# Patient Record
Sex: Female | Born: 1970 | Race: White | Hispanic: No | Smoking: Never smoker
Health system: Southern US, Community
[De-identification: ages and names within clinical notes are randomized; demographics above are authoritative.]

## PROBLEM LIST (undated history)

## (undated) DIAGNOSIS — Z9289 Personal history of other medical treatment: Secondary | ICD-10-CM

## (undated) DIAGNOSIS — Z803 Family history of malignant neoplasm of breast: Secondary | ICD-10-CM

## (undated) DIAGNOSIS — G43839 Menstrual migraine, intractable, without status migrainosus: Secondary | ICD-10-CM

## (undated) DIAGNOSIS — Z1379 Encounter for other screening for genetic and chromosomal anomalies: Secondary | ICD-10-CM

## (undated) DIAGNOSIS — F32A Depression, unspecified: Secondary | ICD-10-CM

## (undated) HISTORY — DX: Menstrual migraine, intractable, without status migrainosus: G43.839

## (undated) HISTORY — DX: Personal history of other medical treatment: Z92.89

## (undated) HISTORY — PX: AUGMENTATION MAMMAPLASTY: SUR837

## (undated) HISTORY — DX: Encounter for other screening for genetic and chromosomal anomalies: Z13.79

## (undated) HISTORY — DX: Family history of malignant neoplasm of breast: Z80.3

## (undated) HISTORY — DX: Depression, unspecified: F32.A

---
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
## 2009-10-16 ENCOUNTER — Ambulatory Visit: Payer: Self-pay

## 2012-01-23 ENCOUNTER — Emergency Department: Payer: Self-pay | Admitting: Unknown Physician Specialty

## 2012-01-23 LAB — BASIC METABOLIC PANEL
Anion Gap: 10 (ref 7–16)
Chloride: 103 mmol/L (ref 98–107)
Co2: 24 mmol/L (ref 21–32)
Creatinine: 1.19 mg/dL (ref 0.60–1.30)
EGFR (Non-African Amer.): 57 — ABNORMAL LOW
Potassium: 3.9 mmol/L (ref 3.5–5.1)
Sodium: 137 mmol/L (ref 136–145)

## 2012-01-23 LAB — CBC
HCT: 39.5 % (ref 35.0–47.0)
HGB: 13.7 g/dL (ref 12.0–16.0)
MCHC: 34.7 g/dL (ref 32.0–36.0)
MCV: 88 fL (ref 80–100)
Platelet: 310 10*3/uL (ref 150–440)
RBC: 4.51 10*6/uL (ref 3.80–5.20)

## 2012-01-23 LAB — URINALYSIS, COMPLETE
Glucose,UR: NEGATIVE mg/dL (ref 0–75)
Nitrite: NEGATIVE
Ph: 5 (ref 4.5–8.0)
WBC UR: 6 /HPF (ref 0–5)

## 2013-01-01 DIAGNOSIS — Z803 Family history of malignant neoplasm of breast: Secondary | ICD-10-CM

## 2013-01-01 HISTORY — DX: Family history of malignant neoplasm of breast: Z80.3

## 2014-01-28 DIAGNOSIS — Z9289 Personal history of other medical treatment: Secondary | ICD-10-CM

## 2014-01-28 DIAGNOSIS — G43839 Menstrual migraine, intractable, without status migrainosus: Secondary | ICD-10-CM

## 2014-01-28 HISTORY — DX: Personal history of other medical treatment: Z92.89

## 2014-01-28 HISTORY — DX: Menstrual migraine, intractable, without status migrainosus: G43.839

## 2014-01-28 LAB — HM PAP SMEAR

## 2015-05-12 LAB — HM MAMMOGRAPHY

## 2015-05-29 DIAGNOSIS — G43829 Menstrual migraine, not intractable, without status migrainosus: Secondary | ICD-10-CM | POA: Insufficient documentation

## 2016-05-21 ENCOUNTER — Other Ambulatory Visit: Payer: Self-pay | Admitting: Obstetrics and Gynecology

## 2016-05-28 ENCOUNTER — Telehealth: Payer: Self-pay

## 2016-05-28 NOTE — Telephone Encounter (Signed)
Was to be short term option. Have pt go off and see if BTB sx are resolved. If recurs, will try progesterone OCPs again.

## 2016-05-28 NOTE — Telephone Encounter (Signed)
Pt calling to let ABC know the progesterone has mad a world of difference.  Would like to have a rx sent in to [facility] [address].  C# [phone].

## 2016-06-01 NOTE — Telephone Encounter (Signed)
Pt aware.  States she will run out in a few days so we'll find out.

## 2016-12-21 ENCOUNTER — Other Ambulatory Visit: Payer: Self-pay | Admitting: Obstetrics and Gynecology

## 2016-12-21 MED ORDER — NORETHINDRONE 0.35 MG PO TABS: 1.0000 | ORAL_TABLET | Freq: Every day | ORAL | 1 refills | Status: DC

## 2016-12-21 NOTE — Progress Notes (Signed)
Rx camila to help stop bleeding with IUD. F/u prn.

## 2016-12-21 NOTE — Telephone Encounter (Signed)
Pt aware and very appreciative. 

## 2016-12-21 NOTE — Telephone Encounter (Signed)
Patient's call was routed to my voicemail. She requests that Helmut Muster call in the same medication she was prescribed 6 or 8 mos ago that cleared up the spotting. She has now started spotting again, and for the last 3 weeks has had light spotting every day. She would like another prescription, or if Helmut Muster would like to see her she would like to be seen soon. [phone]

## 2016-12-21 NOTE — Telephone Encounter (Signed)
Please advise. Thank you

## 2016-12-21 NOTE — Telephone Encounter (Signed)
Rx camila eRxd. RN to notify pt. F/u prn.

## 2017-02-20 ENCOUNTER — Other Ambulatory Visit: Payer: Self-pay | Admitting: Obstetrics and Gynecology

## 2017-04-15 ENCOUNTER — Ambulatory Visit (INDEPENDENT_AMBULATORY_CARE_PROVIDER_SITE_OTHER): Payer: BLUE CROSS/BLUE SHIELD | Admitting: Obstetrics and Gynecology

## 2017-04-15 ENCOUNTER — Ambulatory Visit (INDEPENDENT_AMBULATORY_CARE_PROVIDER_SITE_OTHER): Payer: BLUE CROSS/BLUE SHIELD

## 2017-04-15 ENCOUNTER — Encounter: Payer: Self-pay | Admitting: Obstetrics and Gynecology

## 2017-04-15 VITALS — BP 110/74 | HR 72 | Ht 64.0 in | Wt 164.0 lb

## 2017-04-15 DIAGNOSIS — Z124 Encounter for screening for malignant neoplasm of cervix: Secondary | ICD-10-CM | POA: Diagnosis not present

## 2017-04-15 DIAGNOSIS — Z975 Presence of (intrauterine) contraceptive device: Secondary | ICD-10-CM

## 2017-04-15 DIAGNOSIS — Z30431 Encounter for routine checking of intrauterine contraceptive device: Secondary | ICD-10-CM

## 2017-04-15 DIAGNOSIS — Z01419 Encounter for gynecological examination (general) (routine) without abnormal findings: Secondary | ICD-10-CM

## 2017-04-15 DIAGNOSIS — Z1151 Encounter for screening for human papillomavirus (HPV): Secondary | ICD-10-CM | POA: Diagnosis not present

## 2017-04-15 DIAGNOSIS — Z1239 Encounter for other screening for malignant neoplasm of breast: Secondary | ICD-10-CM

## 2017-04-15 DIAGNOSIS — T8332XA Displacement of intrauterine contraceptive device, initial encounter: Secondary | ICD-10-CM

## 2017-04-15 DIAGNOSIS — N921 Excessive and frequent menstruation with irregular cycle: Secondary | ICD-10-CM | POA: Diagnosis not present

## 2017-04-15 DIAGNOSIS — Z1231 Encounter for screening mammogram for malignant neoplasm of breast: Secondary | ICD-10-CM | POA: Diagnosis not present

## 2017-04-15 NOTE — Progress Notes (Signed)
PCP:  Patient, No Pcp Per   Chief Complaint  Patient presents with  . Gynecologic Exam    CK IUD/BLEEDING     HPI:      Ms. Karla Banks is a 47 y.o. G2P1011 who LMP was Patient's last menstrual period was 04/10/2017., presents today for her annual examination.  Her menses are regular every 28-30 days, lasting 7 days, light spotting. She also then has 2-3 days light spotting mid cycle with IUD. She has tried POPs with IUD several times with sx relief. Dysmenorrhea none. Migraines are less with IUD and notriptyline daily.   Sex activity: single partner, contraception - IUD. Mirena placed 07/01/15 Last Pap: January 28, 2014  Results were: no abnormalities /neg HPV DNA  Hx of STDs: none  Last mammogram: May 12, 2015  Results were: normal--routine follow-up in 12 months There is a FH of breast cancer MGM, MGGM, and 3 mat grt aunts. Pt is MyRisk neg 10/14, IBIS=19%. There is no FH of ovarian cancer. The patient does do self-breast exams.  Tobacco use: The patient denies current or previous tobacco use. Alcohol use: social drinker No drug use.  Exercise: moderately active  She does get adequate calcium but not Vitamin D in her diet.   Past Medical History:  Diagnosis Date  . Family history of breast cancer 01/01/2013   pt is My Risk neg; IBIS = 19.1%  . Genetic testing of female    negative  . History of mammogram 02/25/14; 05/12/15   NEG; NEG  . History of Papanicolaou smear of cervix 01/28/2014   -/-  . Intractable menstrual migraine without status migrainosus 01/28/2014   ON [place] WK OF OCPS. TRY CONT DOSING    Past Surgical History:  Procedure Laterality Date  . CESAREAN SECTION    . CYSTOSCOPY  1981   BLADDER STRETCHED AS A CHILD    Family History  Problem Relation Age of Onset  . Heart disease Father   . Hyperlipidemia Father   . Cancer Maternal Grandmother 40       BREAST; RARE AORTIC CA - 70S  . Cancer Other        BREAST  . Cancer Other    BREAST    Social History   Socioeconomic History  . Marital status: Married    Spouse name: Not on file  . Number of children: 1  . Years of education: 24  . Highest education level: Not on file  Social Needs  . Financial resource strain: Not on file  . Food insecurity - worry: Not on file  . Food insecurity - inability: Not on file  . Transportation needs - medical: Not on file  . Transportation needs - non-medical: Not on file  Occupational History  . Not on file  Tobacco Use  . Smoking status: Never Smoker  . Smokeless tobacco: Never Used  Substance and Sexual Activity  . Alcohol use: Yes  . Drug use: No  . Sexual activity: Yes    Birth control/protection: IUD  Other Topics Concern  . Not on file  Social History Narrative  . Not on file    Current Meds  Medication Sig  . levonorgestrel (MIRENA, 52 MG,) 20 MCG/24HR IUD 1 Device by Intrauterine route continuous.  . norethindrone (MICRONOR,CAMILA,ERRIN) 0.35 MG tablet Take 1 tablet (0.35 mg total) by mouth daily.  . nortriptyline (PAMELOR) 10 MG capsule Take 1 capsule by mouth daily as needed.  . [DISCONTINUED] norethindrone (MICRONOR,CAMILA,ERRIN) 0.35 MG tablet  Take 1 tablet (0.35 mg total) by mouth daily.     ROS:  Review of Systems  Constitutional: Negative for fatigue, fever and unexpected weight change.  Respiratory: Negative for cough, shortness of breath and wheezing.   Cardiovascular: Negative for chest pain, palpitations and leg swelling.  Gastrointestinal: Negative for blood in stool, constipation, diarrhea, nausea and vomiting.  Endocrine: Negative for cold intolerance, heat intolerance and polyuria.  Genitourinary: Positive for vaginal bleeding. Negative for dyspareunia, dysuria, flank pain, frequency, genital sores, hematuria, menstrual problem, pelvic pain, urgency, vaginal discharge and vaginal pain.  Musculoskeletal: Negative for back pain, joint swelling and myalgias.  Skin: Negative for rash.    Neurological: Negative for dizziness, syncope, light-headedness, numbness and headaches.  Hematological: Negative for adenopathy.  Psychiatric/Behavioral: Negative for agitation, confusion, sleep disturbance and suicidal ideas. The patient is not nervous/anxious.      Objective: BP 110/74   Pulse 72   Ht 5\' 4"  (1.626 m)   Wt 164 lb (74.4 kg)   LMP 04/10/2017 Comment: IUD  BMI 28.15 kg/m    Physical Exam  Constitutional: She is oriented to person, place, and time. She appears well-developed and well-nourished.  Genitourinary: Vagina normal and uterus normal. There is no rash or tenderness on the right labia. There is no rash or tenderness on the left labia. No erythema or tenderness in the vagina. No vaginal discharge found. Right adnexum does not display mass and does not display tenderness. Left adnexum does not display mass and does not display tenderness. Cervix does not exhibit motion tenderness, polyp or visible IUD strings. Uterus is not enlarged or tender.  Genitourinary Comments: IUD STRINGS NOT VISIBLE  Neck: Normal range of motion. No thyromegaly present.  Cardiovascular: Normal rate, regular rhythm and normal heart sounds.  No murmur heard. Pulmonary/Chest: Effort normal and breath sounds normal. Right breast exhibits no mass, no nipple discharge, no skin change and no tenderness. Left breast exhibits no mass, no nipple discharge, no skin change and no tenderness.  Abdominal: Soft. There is no tenderness. There is no guarding.  Musculoskeletal: Normal range of motion.  Neurological: She is alert and oriented to person, place, and time. No cranial nerve deficit.  Psychiatric: She has a normal mood and affect. Her behavior is normal.  Vitals reviewed.   Assessment/Plan: Encounter for annual routine gynecological examination  Cervical cancer screening - Plan: IGP, Aptima HPV  Screening for HPV (human papillomavirus) - Plan: IGP, Aptima HPV  Screening for breast cancer  - Pt to sched mammo. - Plan: MM DIGITAL SCREENING BILATERAL  Encounter for routine checking of intrauterine contraceptive device (IUD) - IUD strings not in place. Check u/s for placement. - Plan: US PELVIS TRANSVANGINAL NON-OB (TV ONLY)  Intrauterine contraceptive device threads lost, initial encounter - Plan: US PELVIS TRANSVANGINAL NON-OB (TV ONLY)  Breakthrough bleeding with IUD - Check IUD placement with u/s. If ok, can cont POPs prn BTB sx. - Plan: US PELVIS TRANSVANGINAL NON-OB (TV ONLY), norethindrone (MICRONOR,CAMILA,ERRIN) 0.35 MG tablet  Meds ordered this encounter  Medications  . norethindrone (MICRONOR,CAMILA,ERRIN) 0.35 MG tablet    Sig: Take 1 tablet (0.35 mg total) by mouth daily.    Dispense:  84 tablet    Refill:  1             GYN counsel breast self exam, mammography screening, adequate intake of calcium and vitamin D, diet and exercise     F/U  Return in about 1 year (around 04/15/2018).  Kule Gascoigne B. Jeron Grahn,  PA-C 04/16/2017 6:42 PM

## 2017-04-15 NOTE — Patient Instructions (Addendum)
I value your feedback and entrusting us with your care. If you get a [facility] patient survey, I would appreciate you taking the time to let us know about your experience today. Thank you!  [facility]: [phone]  [facility]: [phone]  

## 2017-04-16 ENCOUNTER — Telehealth: Payer: Self-pay | Admitting: Obstetrics and Gynecology

## 2017-04-16 ENCOUNTER — Encounter: Payer: Self-pay | Admitting: Obstetrics and Gynecology

## 2017-04-16 MED ORDER — NORETHINDRONE 0.35 MG PO TABS: 1.0000 | ORAL_TABLET | Freq: Every day | ORAL | 1 refills | Status: DC

## 2017-04-16 NOTE — Telephone Encounter (Signed)
LM with results. IUD in place. Cont POPs prn DUB sx. Rx eRxd.

## 2017-04-19 LAB — IGP, APTIMA HPV
HPV Aptima: NEGATIVE
PAP SMEAR COMMENT: 0

## 2017-05-10 DIAGNOSIS — J029 Acute pharyngitis, unspecified: Secondary | ICD-10-CM | POA: Diagnosis not present

## 2017-05-12 DIAGNOSIS — J020 Streptococcal pharyngitis: Secondary | ICD-10-CM | POA: Diagnosis not present

## 2017-05-12 DIAGNOSIS — J029 Acute pharyngitis, unspecified: Secondary | ICD-10-CM | POA: Diagnosis not present

## 2017-05-12 DIAGNOSIS — R6889 Other general symptoms and signs: Secondary | ICD-10-CM | POA: Diagnosis not present

## 2017-06-01 DIAGNOSIS — R519 Headache, unspecified: Secondary | ICD-10-CM | POA: Insufficient documentation

## 2017-06-01 DIAGNOSIS — R51 Headache: Secondary | ICD-10-CM | POA: Diagnosis not present

## 2017-07-08 ENCOUNTER — Ambulatory Visit
Admission: RE | Admit: 2017-07-08 | Discharge: 2017-07-08 | Disposition: A | Discharge status: Home / Self Care | Payer: BLUE CROSS/BLUE SHIELD | Source: Ambulatory Visit | Attending: Obstetrics and Gynecology | Admitting: Obstetrics and Gynecology

## 2017-07-08 DIAGNOSIS — Z1231 Encounter for screening mammogram for malignant neoplasm of breast: Secondary | ICD-10-CM | POA: Insufficient documentation

## 2017-07-08 DIAGNOSIS — Z1239 Encounter for other screening for malignant neoplasm of breast: Secondary | ICD-10-CM

## 2017-07-14 ENCOUNTER — Other Ambulatory Visit: Payer: Self-pay | Admitting: *Deleted

## 2017-07-14 ENCOUNTER — Encounter: Payer: Self-pay | Admitting: Obstetrics and Gynecology

## 2017-07-14 ENCOUNTER — Inpatient Hospital Stay
Admission: RE | Admit: 2017-07-14 | Discharge: 2017-07-14 | Disposition: A | Discharge status: Home / Self Care | Payer: Self-pay | Source: Ambulatory Visit | Attending: *Deleted | Admitting: *Deleted

## 2017-07-14 DIAGNOSIS — Z9289 Personal history of other medical treatment: Secondary | ICD-10-CM

## 2017-11-21 ENCOUNTER — Ambulatory Visit (INDEPENDENT_AMBULATORY_CARE_PROVIDER_SITE_OTHER): Payer: BLUE CROSS/BLUE SHIELD | Admitting: Obstetrics and Gynecology

## 2017-11-21 ENCOUNTER — Encounter: Payer: Self-pay | Admitting: Obstetrics and Gynecology

## 2017-11-21 VITALS — BP 120/80 | HR 50 | Ht 64.0 in | Wt 172.0 lb

## 2017-11-21 DIAGNOSIS — N3000 Acute cystitis without hematuria: Secondary | ICD-10-CM

## 2017-11-21 LAB — POCT URINALYSIS DIPSTICK
Bilirubin, UA: NEGATIVE
Blood, UA: NEGATIVE
Glucose, UA: NEGATIVE
Ketones, UA: NEGATIVE
NITRITE UA: NEGATIVE
PROTEIN UA: NEGATIVE
Spec Grav, UA: 1.02 (ref 1.010–1.025)
pH, UA: 5 (ref 5.0–8.0)

## 2017-11-21 MED ORDER — NITROFURANTOIN MONOHYD MACRO 100 MG PO CAPS: 100.0000 mg | ORAL_CAPSULE | Freq: Two times a day (BID) | ORAL | 0 refills | Status: DC

## 2017-11-21 NOTE — Progress Notes (Signed)
Lavida Patch, Karla Sorrel, PA-C   Chief Complaint  Patient presents with  . Urinary Tract Infection    feels pressure when urinating, no burning or blood in urine, frequency also, lower back pain, since friday night    HPI:      Karla Banks is a 47 y.o. G2P1011 who LMP was No LMP recorded. (Menstrual status: IUD)., presents today for UTI sx of urinary pressure, frequency, LBP, feeling feverish (no temp taken) for the past 4 days. No hematuria/dysuria. No hx of recent UTI. No vag sx.    Past Medical History:  Diagnosis Date  . Family history of breast cancer 01/01/2013   pt is My Risk neg; IBIS = 19.1%  . Genetic testing of female    negative  . History of mammogram 02/25/14; 05/12/15   NEG; NEG  . History of Papanicolaou smear of cervix 01/28/2014   -/-  . Intractable menstrual migraine without status migrainosus 01/28/2014   ON [place] WK OF OCPS. TRY CONT DOSING    Past Surgical History:  Procedure Laterality Date  . CESAREAN SECTION    . CYSTOSCOPY  1981   BLADDER STRETCHED AS A CHILD    Family History  Problem Relation Age of Onset  . Heart disease Father   . Hyperlipidemia Father   . Cancer Maternal Grandmother 40       BREAST; RARE AORTIC CA - 70S  . Cancer Other        BREAST  . Cancer Other        BREAST  . Breast cancer Neg Hx     Social History   Socioeconomic History  . Marital status: Married    Spouse name: Not on file  . Number of children: 1  . Years of education: 80  . Highest education level: Not on file  Occupational History  . Not on file  Social Needs  . Financial resource strain: Not on file  . Food insecurity:    Worry: Not on file    Inability: Not on file  . Transportation needs:    Medical: Not on file    Non-medical: Not on file  Tobacco Use  . Smoking status: Never Smoker  . Smokeless tobacco: Never Used  Substance and Sexual Activity  . Alcohol use: Yes  . Drug use: No  . Sexual activity: Yes    Birth  control/protection: IUD  Lifestyle  . Physical activity:    Days per week: Not on file    Minutes per session: Not on file  . Stress: Not on file  Relationships  . Social connections:    Talks on phone: Not on file    Gets together: Not on file    Attends religious service: Not on file    Active member of club or organization: Not on file    Attends meetings of clubs or organizations: Not on file    Relationship status: Not on file  . Intimate partner violence:    Fear of current or ex partner: Not on file    Emotionally abused: Not on file    Physically abused: Not on file    Forced sexual activity: Not on file  Other Topics Concern  . Not on file  Social History Narrative  . Not on file    Outpatient Medications Prior to Visit  Medication Sig Dispense Refill  . acetaminophen (TYLENOL) 500 MG tablet Take by mouth.    . levonorgestrel (MIRENA, 52 MG,) 20  MCG/24HR IUD 1 Device by Intrauterine route continuous.    . nortriptyline (PAMELOR) 10 MG capsule Take 1 capsule by mouth daily as needed.    . norethindrone (MICRONOR,CAMILA,ERRIN) 0.35 MG tablet Take 1 tablet (0.35 mg total) by mouth daily. 84 tablet 1   No facility-administered medications prior to visit.       ROS:  Review of Systems  Constitutional: Positive for fatigue and fever.  Gastrointestinal: Negative for blood in stool, constipation, diarrhea, nausea and vomiting.  Genitourinary: Positive for frequency. Negative for dyspareunia, dysuria, flank pain, hematuria, urgency, vaginal bleeding, vaginal discharge and vaginal pain.  Musculoskeletal: Negative for back pain.  Skin: Negative for rash.    OBJECTIVE:   Vitals:  BP 120/80   Pulse (!) 50   Ht 5\' 4"  (1.626 m)   Wt 172 lb (78 kg)   BMI 29.52 kg/m   Physical Exam  Constitutional: She is oriented to person, place, and time. She appears well-developed.  Neck: Normal range of motion.  Pulmonary/Chest: Effort normal.  Abdominal: There is no CVA  tenderness.  Neurological: She is alert and oriented to person, place, and time. No cranial nerve deficit.  Psychiatric: She has a normal mood and affect. Her behavior is normal. Judgment and thought content normal.  Vitals reviewed.   Results: Results for orders placed or performed in visit on 11/21/17 (from the past 24 hour(s))  POCT Urinalysis Dipstick     Status: Abnormal   Collection Time: 11/21/17 12:02 PM  Result Value Ref Range   Color, UA yellow    Clarity, UA clear    Glucose, UA Negative Negative   Bilirubin, UA neg    Ketones, UA neg    Spec Grav, UA 1.020 1.010 - 1.025   Blood, UA neg    pH, UA 5.0 5.0 - 8.0   Protein, UA Negative Negative   Urobilinogen, UA     Nitrite, UA neg    Leukocytes, UA Small (1+) (A) Negative   Appearance     Odor       Assessment/Plan: Acute cystitis without hematuria - Min WBCs on dip, pos sx. Rx macobid. Check C&S. F/u prn.  - Plan: nitrofurantoin, macrocrystal-monohydrate, (MACROBID) 100 MG capsule, POCT Urinalysis Dipstick, Urine Culture    Meds ordered this encounter  Medications  . nitrofurantoin, macrocrystal-monohydrate, (MACROBID) 100 MG capsule    Sig: Take 1 capsule (100 mg total) by mouth 2 (two) times daily for 5 days.    Dispense:  10 capsule    Refill:  0    Order Specific Question:   Supervising Provider    Answer:   Nadara Mustard [[id number]]      Return if symptoms worsen or fail to improve.  Charlsie Fleeger B. Rondey Fallen, PA-C 11/21/2017 12:16 PM

## 2017-11-21 NOTE — Patient Instructions (Signed)
I value your feedback and entrusting us with your care. If you get a [facility] patient survey, I would appreciate you taking the time to let us know about your experience today. Thank you! 

## 2017-11-23 LAB — URINE CULTURE: ORGANISM ID, BACTERIA: NO GROWTH

## 2017-12-23 ENCOUNTER — Telehealth: Payer: Self-pay

## 2017-12-23 DIAGNOSIS — Z1329 Encounter for screening for other suspected endocrine disorder: Secondary | ICD-10-CM

## 2017-12-23 NOTE — Telephone Encounter (Signed)
Spoke w/pt. Notified ABC out of office til Tuesday. Pt ok with waiting for her to return.

## 2017-12-23 NOTE — Telephone Encounter (Signed)
Pt inquiring if ABC will put in an order for her to have labs to check her Thyroid. She has some concerns & wants to rule that out of it being an issue. [phone]

## 2017-12-26 NOTE — Telephone Encounter (Signed)
Pls notify pt. Thx

## 2017-12-26 NOTE — Telephone Encounter (Signed)
Lab order placed. Pt needs non-fasting lab appt.

## 2017-12-26 NOTE — Telephone Encounter (Signed)
Called and left vm msg to call back.

## 2017-12-30 NOTE — Telephone Encounter (Signed)
Called, left vm to call back. 

## 2017-12-30 NOTE — Telephone Encounter (Signed)
Pt returning a call [phone]

## 2018-01-03 NOTE — Telephone Encounter (Signed)
Pt returning call regarding thyroid concerns. She apologizes for playing phone tag. [phone]

## 2018-01-03 NOTE — Telephone Encounter (Signed)
Pt aware, transferred to Sara to schedule appt. 

## 2018-01-04 ENCOUNTER — Other Ambulatory Visit: Payer: BLUE CROSS/BLUE SHIELD

## 2018-01-04 DIAGNOSIS — Z1329 Encounter for screening for other suspected endocrine disorder: Secondary | ICD-10-CM | POA: Diagnosis not present

## 2018-01-05 LAB — TSH+FREE T4
Free T4: 1.24 ng/dL (ref 0.82–1.77)
TSH: 1.66 u[IU]/mL (ref 0.450–4.500)

## 2018-01-05 NOTE — Progress Notes (Signed)
Pls let pt know thyroid labs are normal.

## 2018-01-05 NOTE — Progress Notes (Signed)
Pt aware.

## 2018-02-18 DIAGNOSIS — N390 Urinary tract infection, site not specified: Secondary | ICD-10-CM | POA: Diagnosis not present

## 2018-04-25 ENCOUNTER — Telehealth: Payer: Self-pay

## 2018-04-25 NOTE — Telephone Encounter (Signed)
Can she just drop off a specimen for [place] to do UA and C&S? Culture 9/19 was negative event though she had similar sx.

## 2018-04-25 NOTE — Telephone Encounter (Signed)
Left message for pt to drop off urine sample so we can test it and send culture.

## 2018-04-25 NOTE — Telephone Encounter (Signed)
Please advise 

## 2018-04-25 NOTE — Telephone Encounter (Signed)
Can you call her please

## 2018-04-25 NOTE — Telephone Encounter (Signed)
Pt just called triage stating she is 99% sure she has a UTI, same symptoms as before, pressure, dysuria, frequency, and feeling as she cant empty her bladder, wanting to know if a rx can be sent in. Please advise

## 2018-04-27 ENCOUNTER — Ambulatory Visit (INDEPENDENT_AMBULATORY_CARE_PROVIDER_SITE_OTHER): Payer: BLUE CROSS/BLUE SHIELD

## 2018-04-27 DIAGNOSIS — R300 Dysuria: Secondary | ICD-10-CM | POA: Diagnosis not present

## 2018-04-28 ENCOUNTER — Other Ambulatory Visit: Payer: Self-pay | Admitting: Obstetrics and Gynecology

## 2018-04-28 ENCOUNTER — Telehealth: Payer: Self-pay

## 2018-04-28 DIAGNOSIS — N3000 Acute cystitis without hematuria: Secondary | ICD-10-CM

## 2018-04-28 DIAGNOSIS — R300 Dysuria: Secondary | ICD-10-CM | POA: Diagnosis not present

## 2018-04-28 LAB — POCT URINALYSIS DIPSTICK
BILIRUBIN UA: NEGATIVE
Glucose, UA: NEGATIVE
Ketones, UA: NEGATIVE
Nitrite, UA: NEGATIVE
PH UA: 6.5 (ref 5.0–8.0)
Protein, UA: NEGATIVE
RBC UA: NEGATIVE
Spec Grav, UA: 1.025 (ref 1.010–1.025)
UROBILINOGEN UA: NEGATIVE U/dL — AB

## 2018-04-28 MED ORDER — NITROFURANTOIN MONOHYD MACRO 100 MG PO CAPS: 100.0000 mg | ORAL_CAPSULE | Freq: Two times a day (BID) | ORAL | 0 refills | Status: AC

## 2018-04-28 NOTE — Telephone Encounter (Signed)
Rx macrobid eRxd. C&S results should be back by [facility] and will f/u with pt then. Pls notify pt. Thx

## 2018-04-28 NOTE — Telephone Encounter (Signed)
Pt calling for urine results and rx.  Please call her back at [phone].

## 2018-04-28 NOTE — Addendum Note (Signed)
Addended by: Liliane Shi on: 04/28/2018 11:17 AM   Modules accepted: Orders

## 2018-04-28 NOTE — Telephone Encounter (Signed)
Pt aware via vm 

## 2018-04-28 NOTE — Telephone Encounter (Signed)
Please advise when results are back and I will let pt know

## 2018-04-28 NOTE — Progress Notes (Signed)
Rx RF macrobid for UTI sx. C&S sent.

## 2018-04-30 LAB — URINE CULTURE

## 2018-04-30 NOTE — Progress Notes (Signed)
Pls let pt know C&S showed UTI and she is on correct abx. Should be feeling better. Thx

## 2018-05-01 NOTE — Progress Notes (Signed)
Called and LVMTRC. 

## 2018-08-22 DIAGNOSIS — M79671 Pain in right foot: Secondary | ICD-10-CM | POA: Diagnosis not present

## 2018-08-22 DIAGNOSIS — M79672 Pain in left foot: Secondary | ICD-10-CM | POA: Diagnosis not present

## 2018-08-22 DIAGNOSIS — M722 Plantar fascial fibromatosis: Secondary | ICD-10-CM | POA: Diagnosis not present

## 2018-08-23 DIAGNOSIS — M79672 Pain in left foot: Secondary | ICD-10-CM | POA: Diagnosis not present

## 2018-08-23 DIAGNOSIS — M722 Plantar fascial fibromatosis: Secondary | ICD-10-CM | POA: Diagnosis not present

## 2018-12-26 DIAGNOSIS — M722 Plantar fascial fibromatosis: Secondary | ICD-10-CM | POA: Diagnosis not present

## 2019-02-26 DIAGNOSIS — M79671 Pain in right foot: Secondary | ICD-10-CM | POA: Diagnosis not present

## 2019-02-26 DIAGNOSIS — M722 Plantar fascial fibromatosis: Secondary | ICD-10-CM | POA: Diagnosis not present

## 2019-03-30 DIAGNOSIS — M722 Plantar fascial fibromatosis: Secondary | ICD-10-CM | POA: Diagnosis not present

## 2019-03-30 DIAGNOSIS — M6281 Muscle weakness (generalized): Secondary | ICD-10-CM | POA: Diagnosis not present

## 2019-04-03 DIAGNOSIS — M6281 Muscle weakness (generalized): Secondary | ICD-10-CM | POA: Diagnosis not present

## 2019-04-03 DIAGNOSIS — M722 Plantar fascial fibromatosis: Secondary | ICD-10-CM | POA: Diagnosis not present

## 2019-04-06 DIAGNOSIS — M722 Plantar fascial fibromatosis: Secondary | ICD-10-CM | POA: Diagnosis not present

## 2019-04-06 DIAGNOSIS — M6281 Muscle weakness (generalized): Secondary | ICD-10-CM | POA: Diagnosis not present

## 2019-04-10 DIAGNOSIS — M545 Low back pain: Secondary | ICD-10-CM | POA: Diagnosis not present

## 2019-04-10 DIAGNOSIS — M6281 Muscle weakness (generalized): Secondary | ICD-10-CM | POA: Diagnosis not present

## 2019-04-10 DIAGNOSIS — M5137 Other intervertebral disc degeneration, lumbosacral region: Secondary | ICD-10-CM | POA: Diagnosis not present

## 2019-04-10 DIAGNOSIS — M5442 Lumbago with sciatica, left side: Secondary | ICD-10-CM | POA: Diagnosis not present

## 2019-04-10 DIAGNOSIS — M722 Plantar fascial fibromatosis: Secondary | ICD-10-CM | POA: Diagnosis not present

## 2019-04-10 DIAGNOSIS — M5441 Lumbago with sciatica, right side: Secondary | ICD-10-CM | POA: Diagnosis not present

## 2019-04-13 DIAGNOSIS — M722 Plantar fascial fibromatosis: Secondary | ICD-10-CM | POA: Diagnosis not present

## 2019-04-13 DIAGNOSIS — M6281 Muscle weakness (generalized): Secondary | ICD-10-CM | POA: Diagnosis not present

## 2019-04-17 DIAGNOSIS — M722 Plantar fascial fibromatosis: Secondary | ICD-10-CM | POA: Diagnosis not present

## 2019-04-17 DIAGNOSIS — M6281 Muscle weakness (generalized): Secondary | ICD-10-CM | POA: Diagnosis not present

## 2019-04-20 DIAGNOSIS — M722 Plantar fascial fibromatosis: Secondary | ICD-10-CM | POA: Diagnosis not present

## 2019-04-20 DIAGNOSIS — M6281 Muscle weakness (generalized): Secondary | ICD-10-CM | POA: Diagnosis not present

## 2019-07-03 NOTE — Progress Notes (Signed)
PCP:  Rica Records, PA-C   Chief Complaint  Patient presents with  . Gynecologic Exam  . IUD removal    due to vaginal bleeding on/off for the last couple of months, no abnormal pain     HPI:      Ms. Karla Banks is a 49 y.o. G2P1011 who LMP was No LMP recorded. (Menstrual status: IUD)., presents today for her annual examination.  Her menses have been absent with IUD. Used to be monthly a couple yrs ago. Now having random light bleeding/spotting lasting for days to weeks, past couple of months. Would like IUD removed, doesn't need for contraception anymore either. Having sleep issues and vasomotor sx. No dsymen. Migraines are less.   Sex activity: single partner, contraception - vasectomy and IUD. Mirena placed 07/01/15.  Last Pap: 04/15/17  Results were: no abnormalities /neg HPV DNA  Hx of STDs: none  Last mammogram: 07/08/17  Results were: normal--routine follow-up in 12 months There is a FH of breast cancer MGM, MGGM, and 3 mat grt aunts. Pt is MyRisk neg 10/14, IBIS=19%. There is no FH of ovarian cancer. The patient does do self-breast exams.  Tobacco use: The patient denies current or previous tobacco use. Alcohol use: social drinker No drug use.  Exercise: very active  She does get adequate calcium but not Vitamin D in her diet. No recent fasting labs.  Past Medical History:  Diagnosis Date  . Family history of breast cancer 01/01/2013   pt is My Risk neg; IBIS = 19.1%  . Genetic testing of female    negative  . History of mammogram 02/25/14; 05/12/15   NEG; NEG  . History of Papanicolaou smear of cervix 01/28/2014   -/-  . Intractable menstrual migraine without status migrainosus 01/28/2014   ON [place] WK OF OCPS. TRY CONT DOSING    Past Surgical History:  Procedure Laterality Date  . CESAREAN SECTION    . CYSTOSCOPY  1981   BLADDER STRETCHED AS A CHILD    Family History  Problem Relation Age of Onset  . Heart disease Father   . Hyperlipidemia  Father   . Cancer Maternal Grandmother 40       BREAST; RARE AORTIC CA - 70S  . Cancer Other        BREAST  . Cancer Other        BREAST  . Breast cancer Neg Hx     Social History   Socioeconomic History  . Marital status: Married    Spouse name: Not on file  . Number of children: 1  . Years of education: 41  . Highest education level: Not on file  Occupational History  . Not on file  Tobacco Use  . Smoking status: Never Smoker  . Smokeless tobacco: Never Used  Substance and Sexual Activity  . Alcohol use: Yes  . Drug use: No  . Sexual activity: Yes    Birth control/protection: I.U.D.    Comment: Mirena  Other Topics Concern  . Not on file  Social History Narrative  . Not on file   Social Determinants of Health   Financial Resource Strain:   . Difficulty of Paying Living Expenses:   Food Insecurity:   . Worried About Programme researcher, broadcasting/film/video in the Last Year:   . Barista in the Last Year:   Transportation Needs:   . Freight forwarder (Medical):   Marland Kitchen Lack of Transportation (Non-Medical):   Physical Activity:   .  Days of Exercise per Week:   . Minutes of Exercise per Session:   Stress:   . Feeling of Stress :   Social Connections:   . Frequency of Communication with Friends and Family:   . Frequency of Social Gatherings with Friends and Family:   . Attends Religious Services:   . Active Member of Clubs or Organizations:   . Attends Archivist Meetings:   Marland Kitchen Marital Status:   Intimate Partner Violence:   . Fear of Current or Ex-Partner:   . Emotionally Abused:   Marland Kitchen Physically Abused:   . Sexually Abused:     Current Meds  Medication Sig  . nortriptyline (PAMELOR) 10 MG capsule Take 1 capsule by mouth daily as needed.  . [DISCONTINUED] levonorgestrel (MIRENA, 52 MG,) 20 MCG/24HR IUD 1 Device by Intrauterine route continuous.     ROS:  Review of Systems  Constitutional: Negative for fatigue, fever and unexpected weight change.   Respiratory: Negative for cough, shortness of breath and wheezing.   Cardiovascular: Negative for chest pain, palpitations and leg swelling.  Gastrointestinal: Negative for blood in stool, constipation, diarrhea, nausea and vomiting.  Endocrine: Negative for cold intolerance, heat intolerance and polyuria.  Genitourinary: Positive for vaginal bleeding. Negative for dyspareunia, dysuria, flank pain, frequency, genital sores, hematuria, menstrual problem, pelvic pain, urgency, vaginal discharge and vaginal pain.  Musculoskeletal: Negative for back pain, joint swelling and myalgias.  Skin: Negative for rash.  Neurological: Negative for dizziness, syncope, light-headedness, numbness and headaches.  Hematological: Negative for adenopathy.  Psychiatric/Behavioral: Negative for agitation, confusion, sleep disturbance and suicidal ideas. The patient is not nervous/anxious.      Objective: BP 90/60   Ht 5\' 4"  (1.626 m)   Wt 163 lb (73.9 kg)   BMI 27.98 kg/m    Physical Exam Constitutional:      Appearance: She is well-developed.  Genitourinary:     Vulva, vagina, cervix, uterus, right adnexa and left adnexa normal.     No vulval lesion or tenderness noted.     No vaginal discharge, erythema or tenderness.     No cervical polyp.     No IUD strings visualized.     Uterus is not enlarged or tender.     No right or left adnexal mass present.     Right adnexa not tender.     Left adnexa not tender.     Genitourinary Comments: IUD STRINGS NOT VISIBLE  Neck:     Thyroid: No thyromegaly.  Cardiovascular:     Rate and Rhythm: Normal rate and regular rhythm.     Heart sounds: Normal heart sounds. No murmur.  Pulmonary:     Effort: Pulmonary effort is normal.     Breath sounds: Normal breath sounds.  Chest:     Breasts:        Right: No mass, nipple discharge, skin change or tenderness.        Left: No mass, nipple discharge, skin change or tenderness.  Abdominal:     Palpations: Abdomen  is soft.     Tenderness: There is no abdominal tenderness. There is no guarding.  Musculoskeletal:        General: Normal range of motion.     Cervical back: Normal range of motion.  Neurological:     General: No focal deficit present.     Mental Status: She is alert and oriented to person, place, and time.     Cranial Nerves: No cranial nerve deficit.  Skin:  General: Skin is warm and dry.  Psychiatric:        Mood and Affect: Mood normal.        Behavior: Behavior normal.        Thought Content: Thought content normal.        Judgment: Judgment normal.  Vitals reviewed.    IUD Removal Strings of IUD identified and grasped.  IUD removed without problem with Boseman forceps.  Pt tolerated this well.  IUD noted to be intact.  Assessment/Plan: Encounter for annual routine gynecological examination  Encounter for screening mammogram for malignant neoplasm of breast - Plan: MM 3D SCREEN BREAST BILATERAL; pt to sched mammo  Breakthrough bleeding with IUD--Most likely due to 4 yr mark of Mirena. IUD removal today. See if sx resolve. If cont to have AUB, will check labs and GYN u/s. If menses are normal but heavy, can have another IUD. F/u prn.   Encounter for IUD removal  Blood tests for routine general physical examination - Plan: Comprehensive metabolic panel, Lipid panel  Screening cholesterol level - Plan: Lipid panel            GYN counsel breast self exam, mammography screening, adequate intake of calcium and vitamin D, diet and exercise     F/U  Return in about 1 year (around 07/03/2020).  Garvey Westcott B. Naelle Diegel, PA-C 07/04/2019 9:32 AM

## 2019-07-03 NOTE — Patient Instructions (Addendum)
I value your feedback and entrusting us with your care. If you get a [facility] patient survey, I would appreciate you taking the time to let us know about your experience today. Thank you!  As of February 22, 2019, your lab results will be released to your MyChart immediately, before I even have a chance to see them. Please give me time to review them and contact you if there are any abnormalities. Thank you for your patience.   [facility]: [phone]  [facility]: [phone]  

## 2019-07-04 ENCOUNTER — Encounter: Payer: Self-pay | Admitting: Obstetrics and Gynecology

## 2019-07-04 ENCOUNTER — Ambulatory Visit (INDEPENDENT_AMBULATORY_CARE_PROVIDER_SITE_OTHER): Payer: BC Managed Care – PPO | Admitting: Obstetrics and Gynecology

## 2019-07-04 ENCOUNTER — Other Ambulatory Visit: Payer: Self-pay

## 2019-07-04 VITALS — BP 90/60 | Ht 64.0 in | Wt 163.0 lb

## 2019-07-04 DIAGNOSIS — Z13220 Encounter for screening for lipoid disorders: Secondary | ICD-10-CM

## 2019-07-04 DIAGNOSIS — Z975 Presence of (intrauterine) contraceptive device: Secondary | ICD-10-CM

## 2019-07-04 DIAGNOSIS — N921 Excessive and frequent menstruation with irregular cycle: Secondary | ICD-10-CM

## 2019-07-04 DIAGNOSIS — Z01419 Encounter for gynecological examination (general) (routine) without abnormal findings: Secondary | ICD-10-CM | POA: Diagnosis not present

## 2019-07-04 DIAGNOSIS — Z1231 Encounter for screening mammogram for malignant neoplasm of breast: Secondary | ICD-10-CM

## 2019-07-04 DIAGNOSIS — Z30432 Encounter for removal of intrauterine contraceptive device: Secondary | ICD-10-CM | POA: Diagnosis not present

## 2019-07-04 DIAGNOSIS — Z0000 Encounter for general adult medical examination without abnormal findings: Secondary | ICD-10-CM

## 2019-07-05 ENCOUNTER — Other Ambulatory Visit: Payer: BC Managed Care – PPO

## 2019-07-05 DIAGNOSIS — Z0000 Encounter for general adult medical examination without abnormal findings: Secondary | ICD-10-CM

## 2019-07-05 DIAGNOSIS — Z13220 Encounter for screening for lipoid disorders: Secondary | ICD-10-CM | POA: Diagnosis not present

## 2019-07-06 LAB — COMPREHENSIVE METABOLIC PANEL
ALT: 9 IU/L (ref 0–32)
AST: 17 IU/L (ref 0–40)
Albumin/Globulin Ratio: 1.7 (ref 1.2–2.2)
Albumin: 4.3 g/dL (ref 3.8–4.8)
Alkaline Phosphatase: 36 IU/L — ABNORMAL LOW (ref 39–117)
BUN/Creatinine Ratio: 18 (ref 9–23)
BUN: 14 mg/dL (ref 6–24)
Bilirubin Total: 0.8 mg/dL (ref 0.0–1.2)
CO2: 25 mmol/L (ref 20–29)
Calcium: 9 mg/dL (ref 8.7–10.2)
Chloride: 102 mmol/L (ref 96–106)
Creatinine, Ser: 0.8 mg/dL (ref 0.57–1.00)
GFR calc Af Amer: 102 mL/min/{1.73_m2} (ref >59)
GFR calc non Af Amer: 89 mL/min/{1.73_m2} (ref >59)
Globulin, Total: 2.5 g/dL (ref 1.5–4.5)
Glucose: 73 mg/dL (ref 65–99)
Potassium: 4.6 mmol/L (ref 3.5–5.2)
Sodium: 140 mmol/L (ref 134–144)
Total Protein: 6.8 g/dL (ref 6.0–8.5)

## 2019-07-06 LAB — LIPID PANEL
Chol/HDL Ratio: 3.6 ratio (ref 0.0–4.4)
Cholesterol, Total: 160 mg/dL (ref 100–199)
HDL: 44 mg/dL (ref >39)
LDL Chol Calc (NIH): 106 mg/dL — ABNORMAL HIGH (ref 0–99)
Triglycerides: 46 mg/dL (ref 0–149)
VLDL Cholesterol Cal: 10 mg/dL (ref 5–40)

## 2020-01-16 DIAGNOSIS — R519 Headache, unspecified: Secondary | ICD-10-CM | POA: Diagnosis not present

## 2020-01-24 DIAGNOSIS — L570 Actinic keratosis: Secondary | ICD-10-CM | POA: Diagnosis not present

## 2020-01-24 DIAGNOSIS — D485 Neoplasm of uncertain behavior of skin: Secondary | ICD-10-CM | POA: Diagnosis not present

## 2020-01-24 DIAGNOSIS — D2239 Melanocytic nevi of other parts of face: Secondary | ICD-10-CM | POA: Diagnosis not present

## 2020-02-21 DIAGNOSIS — D2339 Other benign neoplasm of skin of other parts of face: Secondary | ICD-10-CM | POA: Diagnosis not present

## 2020-02-21 DIAGNOSIS — D2239 Melanocytic nevi of other parts of face: Secondary | ICD-10-CM | POA: Diagnosis not present

## 2020-03-17 ENCOUNTER — Ambulatory Visit (INDEPENDENT_AMBULATORY_CARE_PROVIDER_SITE_OTHER): Payer: BC Managed Care – PPO | Admitting: Obstetrics and Gynecology

## 2020-03-17 ENCOUNTER — Encounter: Payer: Self-pay | Admitting: Obstetrics and Gynecology

## 2020-03-17 ENCOUNTER — Other Ambulatory Visit: Payer: Self-pay

## 2020-03-17 VITALS — BP 124/74 | Ht 64.0 in | Wt 165.0 lb

## 2020-03-17 DIAGNOSIS — N3000 Acute cystitis without hematuria: Secondary | ICD-10-CM

## 2020-03-17 DIAGNOSIS — R300 Dysuria: Secondary | ICD-10-CM

## 2020-03-17 MED ORDER — NITROFURANTOIN MONOHYD MACRO 100 MG PO CAPS: 100.0000 mg | ORAL_CAPSULE | Freq: Two times a day (BID) | ORAL | 0 refills | Status: AC

## 2020-03-17 NOTE — Progress Notes (Signed)
  HPI:      Ms. Karla Banks is a 50 y.o. [phone] with no LMP due to IUD placement, presents today for a problem visit.    Urinary Tract Infection: Patient complains of dysuria, frequency, nocturia, suprapubic pressure and urgency . She has had symptoms for 3 days.  Patient denies additional concerns or sx. Patient does have a history of UTI.  Patient does not have a history of pyelonephritis.   PMHx: She  has a past medical history of Family history of breast cancer (01/01/2013), Genetic testing of female, History of mammogram (02/25/14; 05/12/15), History of Papanicolaou smear of cervix (01/28/2014), and Intractable menstrual migraine without status migrainosus (01/28/2014). Also,  has a past surgical history that includes Cesarean section and Cystoscopy (1981)., family history includes Cancer in some other family members; Cancer (age of onset: 50) in her maternal grandmother; Heart disease in her father; Hyperlipidemia in her father.,  reports that she has never smoked. She has never used smokeless tobacco. She reports current alcohol use. She reports that she does not use drugs.  She has a current medication list which includes the following prescription(s): nitrofurantoin (macrocrystal-monohydrate) and nortriptyline. Also, has No Known Allergies.  Review of Systems  Constitutional: Negative for fever and malaise/fatigue.  HENT: Negative.   Eyes: Negative.   Respiratory: Negative.   Cardiovascular: Negative.   Gastrointestinal: Negative.   Genitourinary: Positive for dysuria, frequency and urgency. Negative for flank pain and hematuria.       Suprapubic pressure  Musculoskeletal: Negative.   Skin: Negative.   Neurological: Negative.   Endo/Heme/Allergies: Negative.   Psychiatric/Behavioral: Negative.     Objective: BP 124/74   Ht 5\' 4"  (1.626 m)   Wt 165 lb (74.8 kg)   BMI 28.32 kg/m  Physical Exam Constitutional:      Appearance: Normal appearance. She is normal weight.   Pulmonary:     Effort: Pulmonary effort is normal.  Neurological:     Mental Status: She is alert and oriented to person, place, and time.  Psychiatric:        Mood and Affect: Mood normal.        Behavior: Behavior normal.  Vitals reviewed.     ASSESSMENT/PLAN:   Acute cystitis without hematuria - UA dip- +LE. Urine cx collected. Macrobid rx'd. F/u with additional concerns.   Problem List Items Addressed This Visit   None   Visit Diagnoses    Acute cystitis without hematuria    -  Primary   Relevant Medications   nitrofurantoin, macrocrystal-monohydrate, (MACROBID) 100 MG capsule   Other Relevant Orders   Urine Culture   Dysuria       Relevant Medications   nitrofurantoin, macrocrystal-monohydrate, (MACROBID) 100 MG capsule   Other Relevant Orders   Urine Culture      , CNM, MSN  03/17/20

## 2020-03-19 LAB — URINE CULTURE

## 2020-04-03 DIAGNOSIS — F4323 Adjustment disorder with mixed anxiety and depressed mood: Secondary | ICD-10-CM | POA: Diagnosis not present

## 2020-04-10 DIAGNOSIS — F4323 Adjustment disorder with mixed anxiety and depressed mood: Secondary | ICD-10-CM | POA: Diagnosis not present

## 2020-04-17 DIAGNOSIS — F4323 Adjustment disorder with mixed anxiety and depressed mood: Secondary | ICD-10-CM | POA: Diagnosis not present

## 2020-04-28 ENCOUNTER — Telehealth: Payer: Self-pay

## 2020-04-28 NOTE — Telephone Encounter (Signed)
Patient is scheduled for 05/22/20 with [facility] for Mirena palcement

## 2020-05-01 DIAGNOSIS — F4323 Adjustment disorder with mixed anxiety and depressed mood: Secondary | ICD-10-CM | POA: Diagnosis not present

## 2020-05-02 DIAGNOSIS — H169 Unspecified keratitis: Secondary | ICD-10-CM | POA: Diagnosis not present

## 2020-05-15 DIAGNOSIS — F4323 Adjustment disorder with mixed anxiety and depressed mood: Secondary | ICD-10-CM | POA: Diagnosis not present

## 2020-05-21 NOTE — Telephone Encounter (Signed)
Noted. Mirena reserved for this patient. 

## 2020-05-22 ENCOUNTER — Encounter: Payer: Self-pay | Admitting: Obstetrics and Gynecology

## 2020-05-22 ENCOUNTER — Other Ambulatory Visit: Payer: Self-pay

## 2020-05-22 ENCOUNTER — Ambulatory Visit (INDEPENDENT_AMBULATORY_CARE_PROVIDER_SITE_OTHER): Payer: BC Managed Care – PPO | Admitting: Obstetrics and Gynecology

## 2020-05-22 VITALS — BP 100/60 | Ht 64.0 in | Wt 165.0 lb

## 2020-05-22 DIAGNOSIS — N920 Excessive and frequent menstruation with regular cycle: Secondary | ICD-10-CM | POA: Diagnosis not present

## 2020-05-22 DIAGNOSIS — F321 Major depressive disorder, single episode, moderate: Secondary | ICD-10-CM | POA: Diagnosis not present

## 2020-05-22 DIAGNOSIS — Z30430 Encounter for insertion of intrauterine contraceptive device: Secondary | ICD-10-CM

## 2020-05-22 DIAGNOSIS — F411 Generalized anxiety disorder: Secondary | ICD-10-CM | POA: Diagnosis not present

## 2020-05-22 MED ORDER — MIRENA (52 MG) 20 MCG/24HR IU IUD: 1.0000 | INTRAUTERINE_SYSTEM | Freq: Once | INTRAUTERINE | 0 refills | Status: AC

## 2020-05-22 NOTE — Patient Instructions (Addendum)
I value your feedback and you entrusting us with your care. If you get a [facility] patient survey, I would appreciate you taking the time to let us know about your experience today. Thank you!  [facility] [phone]  Instructions after IUD insertion  Most women experience no significant problems after insertion of an IUD, however minor cramping and spotting for a few days is common. Cramps may be treated with ibuprofen 800mg every 8 hours or Tylenol 650 mg every 4 hours. Contact [facility] immediately if you experience any of the following symptoms during the next week: temperature >99.6 degrees, worsening pelvic pain, abdominal pain, fainting, unusually heavy vaginal bleeding, foul vaginal discharge, or if you think you have expelled the IUD.  Nothing inserted in the vagina for 48 hours. You will be scheduled for a follow up visit in approximately four weeks.  You should check monthly to be sure you can feel the IUD strings in the upper vagina. If you are having a monthly period, try to check after each period. If you cannot feel the IUD strings,  contact [facility] immediately so we can do an exam to determine if the IUD has been expelled.   Please use backup protection until we can confirm the IUD is in place.  Call [facility] if you are exposed to or diagnosed with a sexually transmitted infection, as we will need to discuss whether it is safe for you to continue using an IUD.   

## 2020-05-22 NOTE — Progress Notes (Signed)
     Chief Complaint  Patient presents with  . Contraception    Mirena insertion     IUD PROCEDURE NOTE:  Karla Banks is a 50 y.o. G2P1011 here for Mirena  IUD insertion for menorrhagia. Had mirena placed in past for menorrhagia and BC, removed 4/21 due to irregular bleeding and no need for contraception due to partner with vasectomy. Menses now monthly, lasting 7 days, heavy flow with clots for a couple days, no BTB, mild dysmen. Would like another IUD.  UTI sx from 1/22 resolved after abx use.  Annual due 4/22.  BP 100/60   Ht 5\' 4"  (1.626 m)   Wt 165 lb (74.8 kg)   LMP 05/20/2020 (Exact Date)   BMI 28.32 kg/m   IUD Insertion Procedure Note Patient identified, informed consent performed, consent signed.   Discussed risks of irregular bleeding, cramping, infection, malpositioning or misplacement of the IUD outside the uterus which may require further procedure such as laparoscopy, risk of failure <1%. Time out was performed.    Speculum placed in the vagina.  Cervix visualized.  Cleaned with Betadine x 2.  Grasped anteriorly with a single tooth tenaculum.  Uterus sounded to 9.0 cm.   IUD placed per manufacturer's recommendations.  Strings trimmed to 3 cm. Tenaculum was removed, good hemostasis noted.  Patient tolerated procedure well.   ASSESSMENT:  Encounter for IUD insertion - Plan: levonorgestrel (MIRENA, 52 MG,) 20 MCG/24HR IUD  Menorrhagia with regular cycle - Plan: levonorgestrel (MIRENA, 52 MG,) 20 MCG/24HR IUD; try IUD again. Had relief in past. F/u prn/annual.    Meds ordered this encounter  Medications  . levonorgestrel (MIRENA, 52 MG,) 20 MCG/24HR IUD    Sig: 1 Intra Uterine Device (1 each total) by Intrauterine route once for 1 dose.    Dispense:  1 Intra Uterine Device    Refill:  0    Order Specific Question:   Supervising Provider    Answer:   07/20/2020 Nadara Mustard     Plan:  Patient was given post-procedure instructions.  She was advised to  have backup contraception for one week.   Call if you are having increasing pain, cramps or bleeding or if you have a fever greater than 100.4 degrees F., shaking chills, nausea or vomiting. Patient was also asked to check IUD strings periodically and follow up in 4 weeks for IUD check.  Return in about 6 weeks (around 07/03/2020) for annual, IUD f/u.  Valeriano Bain B. Amillion [place], PA-C 05/22/2020 11:09 AM

## 2020-06-05 DIAGNOSIS — F321 Major depressive disorder, single episode, moderate: Secondary | ICD-10-CM | POA: Diagnosis not present

## 2020-06-05 DIAGNOSIS — F411 Generalized anxiety disorder: Secondary | ICD-10-CM | POA: Diagnosis not present

## 2020-06-26 DIAGNOSIS — F4323 Adjustment disorder with mixed anxiety and depressed mood: Secondary | ICD-10-CM | POA: Diagnosis not present

## 2020-07-02 NOTE — Progress Notes (Signed)
PCP:  Rica Records, PA-C   Chief Complaint  Patient presents with  . Gynecologic Exam  . IUD check     HPI:      Ms. Karla Banks is a 50 y.o. G2P1011 who LMP was No LMP recorded. (Menstrual status: IUD)., presents today for her annual examination.  Her menses had been absent with IUD but had removed 4/21 due to BTB and no longer needed for contraception. Then resumed her monthly, heavy menses, lasting 7 days. IUD replaced 05/22/20; having daily light to heavy spotting only now, no heavy bleeding. No dysmen.   Sex activity: single partner, contraception - vasectomy and IUD. Mirena placed 05/22/20. No dyspareunia/postcoital bleeding. Last Pap: 04/15/17  Results were: no abnormalities /neg HPV DNA  Hx of STDs: none  Last mammogram: 07/08/17  Results were: normal--routine follow-up in 12 months There is a FH of breast cancer MGM, MGGM, and 3 mat grt aunts. Pt is MyRisk neg 10/14, IBIS=19%. There is no FH of ovarian cancer. The patient does do self-breast exams.  Tobacco use: The patient denies current or previous tobacco use. Alcohol use: social drinker No drug use.  Exercise: very active  Colonoscopy: never  She does get adequate calcium and Vitamin D in her diet. Normal fasting labs 2021.  Past Medical History:  Diagnosis Date  . Family history of breast cancer 01/01/2013   pt is My Risk neg; IBIS = 19.1%  . Genetic testing of female    negative  . History of mammogram 02/25/14; 05/12/15   NEG; NEG  . History of Papanicolaou smear of cervix 01/28/2014   -/-  . Intractable menstrual migraine without status migrainosus 01/28/2014   ON [place] WK OF OCPS. TRY CONT DOSING    Past Surgical History:  Procedure Laterality Date  . CESAREAN SECTION    . CYSTOSCOPY  1981   BLADDER STRETCHED AS A CHILD    Family History  Problem Relation Age of Onset  . Heart disease Father   . Hyperlipidemia Father   . Cancer Maternal Grandmother 40       BREAST; RARE AORTIC CA -  70S  . Cancer Other        BREAST  . Cancer Other        BREAST  . Breast cancer Neg Hx     Social History   Socioeconomic History  . Marital status: Married    Spouse name: Not on file  . Number of children: 1  . Years of education: 61  . Highest education level: Not on file  Occupational History  . Not on file  Tobacco Use  . Smoking status: Never Smoker  . Smokeless tobacco: Never Used  Vaping Use  . Vaping Use: Never used  Substance and Sexual Activity  . Alcohol use: Yes  . Drug use: No  . Sexual activity: Yes    Birth control/protection: None, I.U.D.    Comment: Mirena  Other Topics Concern  . Not on file  Social History Narrative  . Not on file   Social Determinants of Health   Financial Resource Strain: Not on file  Food Insecurity: Not on file  Transportation Needs: Not on file  Physical Activity: Not on file  Stress: Not on file  Social Connections: Not on file  Intimate Partner Violence: Not on file    Current Meds  Medication Sig  . nortriptyline (PAMELOR) 10 MG capsule Take 1 capsule by mouth daily as needed.  . rizatriptan (MAXALT)  10 MG tablet Take 10 mg at headache onset can repeat once in 2 hours if needed no more than 2 tabs in 24 hours  . sertraline (ZOLOFT) 50 MG tablet TAKE 1 DAILYWITH BREAKFAST     ROS:  Review of Systems  Constitutional: Negative for fatigue, fever and unexpected weight change.  Respiratory: Negative for cough, shortness of breath and wheezing.   Cardiovascular: Negative for chest pain, palpitations and leg swelling.  Gastrointestinal: Negative for blood in stool, constipation, diarrhea, nausea and vomiting.  Endocrine: Negative for cold intolerance, heat intolerance and polyuria.  Genitourinary: Positive for vaginal bleeding. Negative for dyspareunia, dysuria, flank pain, frequency, genital sores, hematuria, menstrual problem, pelvic pain, urgency, vaginal discharge and vaginal pain.  Musculoskeletal: Negative for  back pain, joint swelling and myalgias.  Skin: Negative for rash.  Neurological: Negative for dizziness, syncope, light-headedness, numbness and headaches.  Hematological: Negative for adenopathy.  Psychiatric/Behavioral: Negative for agitation, confusion, sleep disturbance and suicidal ideas. The patient is not nervous/anxious.      Objective: BP 100/70   Ht 5\' 4"  (1.626 m)   Wt 166 lb (75.3 kg)   BMI 28.49 kg/m    Physical Exam Constitutional:      Appearance: She is well-developed.  Genitourinary:     Vulva normal.     Right Labia: No rash, tenderness or lesions.    Left Labia: No tenderness, lesions or rash.    No vaginal discharge, erythema or tenderness.      Right Adnexa: not tender and no mass present.    Left Adnexa: not tender and no mass present.    No cervical friability or polyp.     IUD strings visualized.     Uterus is not enlarged or tender.  Breasts:     Right: No mass, nipple discharge, skin change or tenderness.     Left: No mass, nipple discharge, skin change or tenderness.    Neck:     Thyroid: No thyromegaly.  Cardiovascular:     Rate and Rhythm: Normal rate and regular rhythm.     Heart sounds: Normal heart sounds. No murmur heard.   Pulmonary:     Effort: Pulmonary effort is normal.     Breath sounds: Normal breath sounds.  Abdominal:     Palpations: Abdomen is soft.     Tenderness: There is no abdominal tenderness. There is no guarding or rebound.  Musculoskeletal:        General: Normal range of motion.     Cervical back: Normal range of motion.  Lymphadenopathy:     Cervical: No cervical adenopathy.  Neurological:     General: No focal deficit present.     Mental Status: She is alert and oriented to person, place, and time.     Cranial Nerves: No cranial nerve deficit.  Skin:    General: Skin is warm and dry.  Psychiatric:        Mood and Affect: Mood normal.        Behavior: Behavior normal.        Thought Content: Thought  content normal.        Judgment: Judgment normal.  Vitals reviewed.     Assessment/Plan: Encounter for annual routine gynecological examination  Encounter for routine checking of intrauterine contraceptive device (IUD); IUD strings in cx os. Due for removal 3/29  Breakthrough bleeding with IUD--since placement. Reassurance. F/u if sx persist for GYN u/s.  Encounter for screening mammogram for malignant neoplasm of breast - Plan:  MM 3D SCREEN BREAST BILATERAL; pt to sched mammo  Family history of breast cancer - Plan: MM 3D SCREEN BREAST BILATERAL; My Risk neg  Screening for colon cancer - Plan: Ambulatory referral to Gastroenterology. colonoscopy/cologuard discussed. Pt elects colonoscopy. Ref sent.             GYN counsel breast self exam, mammography screening, adequate intake of calcium and vitamin D, diet and exercise     F/U  Return in about 1 year (around 07/08/2021).  Ante Arredondo B. Mailin Coglianese, PA-C 07/08/2020 10:54 AM

## 2020-07-02 NOTE — Patient Instructions (Addendum)
I value your feedback and you entrusting us with your care. If you get a [facility] patient survey, I would appreciate you taking the time to let us know about your experience today. Thank you!  [facility]: [phone]      

## 2020-07-04 DIAGNOSIS — F321 Major depressive disorder, single episode, moderate: Secondary | ICD-10-CM | POA: Diagnosis not present

## 2020-07-04 DIAGNOSIS — F411 Generalized anxiety disorder: Secondary | ICD-10-CM | POA: Diagnosis not present

## 2020-07-08 ENCOUNTER — Ambulatory Visit (INDEPENDENT_AMBULATORY_CARE_PROVIDER_SITE_OTHER): Payer: BC Managed Care – PPO | Admitting: Obstetrics and Gynecology

## 2020-07-08 ENCOUNTER — Encounter: Payer: Self-pay | Admitting: Obstetrics and Gynecology

## 2020-07-08 ENCOUNTER — Other Ambulatory Visit: Payer: Self-pay

## 2020-07-08 VITALS — BP 100/70 | Ht 64.0 in | Wt 166.0 lb

## 2020-07-08 DIAGNOSIS — Z1231 Encounter for screening mammogram for malignant neoplasm of breast: Secondary | ICD-10-CM | POA: Diagnosis not present

## 2020-07-08 DIAGNOSIS — N921 Excessive and frequent menstruation with irregular cycle: Secondary | ICD-10-CM

## 2020-07-08 DIAGNOSIS — Z30431 Encounter for routine checking of intrauterine contraceptive device: Secondary | ICD-10-CM | POA: Diagnosis not present

## 2020-07-08 DIAGNOSIS — Z01419 Encounter for gynecological examination (general) (routine) without abnormal findings: Secondary | ICD-10-CM

## 2020-07-08 DIAGNOSIS — Z803 Family history of malignant neoplasm of breast: Secondary | ICD-10-CM

## 2020-07-08 DIAGNOSIS — Z975 Presence of (intrauterine) contraceptive device: Secondary | ICD-10-CM

## 2020-07-08 DIAGNOSIS — Z1211 Encounter for screening for malignant neoplasm of colon: Secondary | ICD-10-CM

## 2020-07-15 DIAGNOSIS — R519 Headache, unspecified: Secondary | ICD-10-CM | POA: Diagnosis not present

## 2020-07-17 ENCOUNTER — Encounter: Payer: Self-pay | Admitting: *Deleted

## 2020-08-13 DIAGNOSIS — F321 Major depressive disorder, single episode, moderate: Secondary | ICD-10-CM | POA: Diagnosis not present

## 2020-08-13 DIAGNOSIS — F411 Generalized anxiety disorder: Secondary | ICD-10-CM | POA: Diagnosis not present

## 2020-09-13 DIAGNOSIS — J029 Acute pharyngitis, unspecified: Secondary | ICD-10-CM | POA: Diagnosis not present

## 2020-09-22 ENCOUNTER — Other Ambulatory Visit: Payer: Self-pay

## 2020-09-22 ENCOUNTER — Encounter: Payer: Self-pay | Admitting: Cardiology

## 2020-09-22 ENCOUNTER — Ambulatory Visit: Payer: BC Managed Care – PPO | Admitting: Cardiology

## 2020-09-22 VITALS — BP 122/72 | HR 60 | Ht 64.0 in | Wt 171.0 lb

## 2020-09-22 DIAGNOSIS — Z8249 Family history of ischemic heart disease and other diseases of the circulatory system: Secondary | ICD-10-CM

## 2020-09-22 DIAGNOSIS — R072 Precordial pain: Secondary | ICD-10-CM

## 2020-09-22 MED ORDER — METOPROLOL TARTRATE 50 MG PO TABS: 50.0000 mg | ORAL_TABLET | Freq: Once | ORAL | 0 refills | Status: DC

## 2020-09-22 NOTE — Patient Instructions (Signed)
Medication Instructions:  Your physician recommends that you continue on your current medications as directed. Please refer to the Current Medication list given to you today.  *If you need a refill on your cardiac medications before your next appointment, please call your pharmacy*   Lab Work:  BMP to be drawn today.  If you have labs (blood work) drawn today and your tests are completely normal, you will receive your results only by: MyChart Message (if you have MyChart) OR A paper copy in the mail If you have any lab test that is abnormal or we need to change your treatment, we will call you to review the results.   Testing/Procedures:   Your physician has requested that you have an echocardiogram. Echocardiography is a painless test that uses sound waves to create images of your heart. It provides your doctor with information about the size and shape of your heart and how well your heart's chambers and valves are working. This procedure takes approximately one hour. There are no restrictions for this procedure.  2.   Your physician has requested that you have cardiac CT. Cardiac computed tomography (CT) is a painless test that uses an x-ray machine to take clear, detailed pictures of your heart. For further information     Your cardiac CT will be scheduled at :  [facility] [address] [place], [place] [address] [phone]  Please arrive 15 mins early for check-in and test prep.  Please follow these instructions carefully (unless otherwise directed):    On the Night Before the Test: Be sure to Drink plenty of water. Do not consume any caffeinated/decaffeinated beverages or chocolate 12 hours prior to your test.    On the Day of the Test: Drink plenty of water until 1 hour prior to the test. Do not eat any food 4 hours prior to the test. You may take your regular medications prior to the test.  Take metoprolol  (Lopressor) two hours prior to test. HOLD Furosemide/Hydrochlorothiazide morning of the test. FEMALES- please wear underwire-free bra if available        After the Test: Drink plenty of water. After receiving IV contrast, you may experience a mild flushed feeling. This is normal. On occasion, you may experience a mild rash up to 24 hours after the test. This is not dangerous. If this occurs, you can take Benadryl 25 mg and increase your fluid intake. If you experience trouble breathing, this can be serious. If it is severe call 911 IMMEDIATELY. If it is mild, please call our office. If you take any of these medications: Glipizide/Metformin, Avandament, Glucavance, please do not take 48 hours after completing test unless otherwise instructed.   Once we have confirmed authorization from your insurance company, we will call you to set up a date and time for your test. Based on how quickly your insurance processes prior authorizations requests, please allow up to 4 weeks to be contacted for scheduling your Cardiac CT appointment. Be advised that routine Cardiac CT appointments could be scheduled as many as 8 weeks after your provider has ordered it.  For non-scheduling related questions, please contact the cardiac imaging nurse navigator should you have any questions/concerns: Rockwell Alexandria, Cardiac Imaging Nurse Navigator Larey Brick, Cardiac Imaging Nurse Navigator [facility] Direct Office Dial: [phone]   For scheduling needs, including cancellations and rescheduling, please call [place], [phone].   Follow-Up: At [facility], you and your health needs are  our priority.  As part of our continuing mission to provide you with exceptional heart care, we have created designated Provider Care Teams.  These Care Teams include your primary Cardiologist (physician) and Advanced Practice Providers (APPs -  Physician Assistants and Nurse Practitioners) who all  work together to provide you with the care you need, when you need it.  We recommend signing up for the patient portal called "MyChart".  Sign up information is provided on this After Visit Summary.  MyChart is used to connect with patients for Virtual Visits (Telemedicine).  Patients are able to view lab/test results, encounter notes, upcoming appointments, etc.  Non-urgent messages can be sent to your provider as well.   To learn more about what you can do with MyChart, go to [email or web address].    Your next appointment:   6 week(s)  The format for your next appointment:   In Person  Provider:   Debbe Odea, MD   Other Instructions

## 2020-09-22 NOTE — Progress Notes (Signed)
Cardiology Office Note:    Date:  09/22/2020   ID:  Karla Banks, DOB January 04, 1971, MRN [id number]  PCP:  Rica Records, PA-C   [facility] Providers Cardiologist:  None     Referring MD: Rica Records, PA-C   Chief Complaint  Patient presents with   New Patient (Initial Visit)    Self referral -- Chest discomfort. Meds reviewed verbally with paitent.    Karla Banks is a 50 y.o. female who is being seen today for the evaluation of chest pain at the request of Copland, Alicia B, PA-C.   History of Present Illness:    Karla Banks is a 50 y.o. female with a family history of early CAD who presents due to chest pain.  Patient [place] the beach with [place] about 5 days ago when she noticed central chest discomfort which she describes as squeezing in nature, radiating to her neck.  Symptoms persisted throughout the night.  She did not exert herself much the next day.  She has a family history of early CAD with father having heart attack in his 28s, mother MI in her 62s.  She otherwise feels okay, denies any personal history of heart disease.  Not sure if symptoms are GI related.  Due to strong family history, patient wants to get checked out.  Past Medical History:  Diagnosis Date   Family history of breast cancer 01/01/2013   pt is My Risk neg; IBIS = 19.1%   Genetic testing of female    negative   History of mammogram 02/25/14; 05/12/15   NEG; NEG   History of Papanicolaou smear of cervix 01/28/2014   -/-   Intractable menstrual migraine without status migrainosus 01/28/2014   [place] WK OF OCPS. TRY CONT DOSING    Past Surgical History:  Procedure Laterality Date   CESAREAN SECTION     CYSTOSCOPY  1981   BLADDER STRETCHED AS A CHILD    Current Medications: Current Meds  Medication Sig   levonorgestrel (MIRENA, 52 MG,) 20 MCG/24HR IUD 1 Intra Uterine Device (1 each total) by Intrauterine route once for 1 dose.   metoprolol tartrate (LOPRESSOR) 50 MG  tablet Take 1 tablet (50 mg total) by mouth once for 1 dose. Take 2 hours prior to your CT scan.   nortriptyline (PAMELOR) 10 MG capsule Take 1 capsule by mouth daily as needed.   rizatriptan (MAXALT) 10 MG tablet Take 10 mg at headache onset can repeat once in 2 hours if needed no more than 2 tabs in 24 hours   sertraline (ZOLOFT) 50 MG tablet TAKE 1 DAILYWITH BREAKFAST     Allergies:   Patient has no known allergies.   Social History   Socioeconomic History   Marital status: Married    Spouse name: Not on file   Number of children: 1   Years of education: 16   Highest education level: Not on file  Occupational History   Not on file  Tobacco Use   Smoking status: Never   Smokeless tobacco: Never  Vaping Use   Vaping Use: Never used  Substance and Sexual Activity   Alcohol use: Yes   Drug use: No   Sexual activity: Yes    Birth control/protection: None, I.U.D.    Comment: Mirena  Other Topics Concern   Not on file  Social History Narrative   Not on file   Social Determinants of Health   Financial Resource Strain: Not on file  Food Insecurity: Not on file  Transportation Needs: Not on file  Physical Activity: Not on file  Stress: Not on file  Social Connections: Not on file     Family History: The patient's family history includes Cancer in some other family members; Cancer (age of onset: 84) in her maternal grandmother; Heart disease in her father; Hyperlipidemia in her father. There is no history of Breast cancer.  ROS:   Please see the history of present illness.     All other systems reviewed and are negative.  EKGs/Labs/Other Studies Reviewed:    The following studies were reviewed today:   EKG:  EKG is  ordered today.  The ekg ordered today demonstrates normal sinus rhythm, normal ECG.  Recent Labs: No results found for requested labs within last 8760 hours.  Recent Lipid Panel    Component Value Date/Time   CHOL 160 07/05/2019 0821   TRIG 46  07/05/2019 0821   HDL 44 07/05/2019 0821   CHOLHDL 3.6 07/05/2019 0821   LDLCALC 106 (H) 07/05/2019 0821     Risk Assessment/Calculations:          Physical Exam:    VS:  BP 122/72 (BP Location: Left Arm, Patient Position: Sitting, Cuff Size: Normal)   Pulse 60   Ht 5\' 4"  (1.626 m)   Wt 171 lb (77.6 kg)   SpO2 98%   BMI 29.35 kg/m     Wt Readings from Last 3 Encounters:  09/22/20 171 lb (77.6 kg)  07/08/20 166 lb (75.3 kg)  05/22/20 165 lb (74.8 kg)     GEN:  Well nourished, well developed in no acute distress HEENT: Normal NECK: No JVD; No carotid bruits LYMPHATICS: No lymphadenopathy CARDIAC: RRR, no murmurs, rubs, gallops RESPIRATORY:  Clear to auscultation without rales, wheezing or rhonchi  ABDOMEN: Soft, non-tender, non-distended MUSCULOSKELETAL:  No edema; No deformity  SKIN: Warm and dry NEUROLOGIC:  Alert and oriented x 3 PSYCHIATRIC:  Normal affect   ASSESSMENT:    1. Precordial pain   2. Family history of early CAD    PLAN:    In order of problems listed above:  Chest pain, strong family history of early CAD.  Get echo to evaluate systolic and diastolic function, get coronary CTA to evaluate presence of CAD. Family history of early CAD, coronary CTA as above.  Follow-up after echo and coronary CTA.     Medication Adjustments/Labs and Tests Ordered: Current medicines are reviewed at length with the patient today.  Concerns regarding medicines are outlined above.  Orders Placed This Encounter  Procedures   CT CORONARY MORPH W/CTA COR W/SCORE W/CA W/CM &/OR WO/CM   Basic metabolic panel   EKG 12-Lead   ECHOCARDIOGRAM COMPLETE   Meds ordered this encounter  Medications   metoprolol tartrate (LOPRESSOR) 50 MG tablet    Sig: Take 1 tablet (50 mg total) by mouth once for 1 dose. Take 2 hours prior to your CT scan.    Dispense:  1 tablet    Refill:  0    Patient Instructions  Medication Instructions:  Your physician recommends that you  continue on your current medications as directed. Please refer to the Current Medication list given to you today.  *If you need a refill on your cardiac medications before your next appointment, please call your pharmacy*   Lab Work:  BMP to be drawn today.  If you have labs (blood work) drawn today and your tests are completely normal, you will receive your  results only by: MyChart Message (if you have MyChart) OR A paper copy in the mail If you have any lab test that is abnormal or we need to change your treatment, we will call you to review the results.   Testing/Procedures:   Your physician has requested that you have an echocardiogram. Echocardiography is a painless test that uses sound waves to create images of your heart. It provides your doctor with information about the size and shape of your heart and how well your heart's chambers and valves are working. This procedure takes approximately one hour. There are no restrictions for this procedure.  2.   Your physician has requested that you have cardiac CT. Cardiac computed tomography (CT) is a painless test that uses an x-ray machine to take clear, detailed pictures of your heart. For further information     Your cardiac CT will be scheduled at :  [facility] [address] [place], [place] [address] [phone]  Please arrive 15 mins early for check-in and test prep.  Please follow these instructions carefully (unless otherwise directed):    On the Night Before the Test: Be sure to Drink plenty of water. Do not consume any caffeinated/decaffeinated beverages or chocolate 12 hours prior to your test.    On the Day of the Test: Drink plenty of water until 1 hour prior to the test. Do not eat any food 4 hours prior to the test. You may take your regular medications prior to the test.  Take metoprolol (Lopressor) two hours prior to test. HOLD  Furosemide/Hydrochlorothiazide morning of the test. FEMALES- please wear underwire-free bra if available        After the Test: Drink plenty of water. After receiving IV contrast, you may experience a mild flushed feeling. This is normal. On occasion, you may experience a mild rash up to 24 hours after the test. This is not dangerous. If this occurs, you can take Benadryl 25 mg and increase your fluid intake. If you experience trouble breathing, this can be serious. If it is severe call 911 IMMEDIATELY. If it is mild, please call our office. If you take any of these medications: Glipizide/Metformin, Avandament, Glucavance, please do not take 48 hours after completing test unless otherwise instructed.   Once we have confirmed authorization from your insurance company, we will call you to set up a date and time for your test. Based on how quickly your insurance processes prior authorizations requests, please allow up to 4 weeks to be contacted for scheduling your Cardiac CT appointment. Be advised that routine Cardiac CT appointments could be scheduled as many as 8 weeks after your provider has ordered it.  For non-scheduling related questions, please contact the cardiac imaging nurse navigator should you have any questions/concerns: Rockwell AlexandriaSara Wallace, Cardiac Imaging Nurse Navigator Larey BrickMerle Prescott, Cardiac Imaging Nurse Navigator [facility] Direct Office Dial: [phone]   For scheduling needs, including cancellations and rescheduling, please call [place], [phone].   Follow-Up: At [facility], you and your health needs are our priority.  As part of our continuing mission to provide you with exceptional heart care, we have created designated Provider Care Teams.  These Care Teams include your primary Cardiologist (physician) and Advanced Practice Providers (APPs -  Physician Assistants and Nurse Practitioners) who all work together to provide you with the care  you need, when you need it.  We recommend signing up for the patient portal called "MyChart".  Sign up information is provided on this After Visit Summary.  MyChart is used to connect with patients for Virtual Visits (Telemedicine).  Patients are able to view lab/test results, encounter notes, upcoming appointments, etc.  Non-urgent messages can be sent to your provider as well.   To learn more about what you can do with MyChart, go to [email or web address].    Your next appointment:   6 week(s)  The format for your next appointment:   In Person  Provider:   Debbe Odea, MD   Other Instructions      Signed, Debbe Odea, MD  09/22/2020 12:45 PM    [facility]

## 2020-09-23 LAB — BASIC METABOLIC PANEL
BUN/Creatinine Ratio: 13 (ref 9–23)
BUN: 9 mg/dL (ref 6–24)
CO2: 22 mmol/L (ref 20–29)
Calcium: 9 mg/dL (ref 8.7–10.2)
Chloride: 99 mmol/L (ref 96–106)
Creatinine, Ser: 0.71 mg/dL (ref 0.57–1.00)
Glucose: 84 mg/dL (ref 65–99)
Potassium: 4.2 mmol/L (ref 3.5–5.2)
Sodium: 137 mmol/L (ref 134–144)
eGFR: 105 mL/min/{1.73_m2} (ref >59)

## 2020-09-24 ENCOUNTER — Ambulatory Visit (INDEPENDENT_AMBULATORY_CARE_PROVIDER_SITE_OTHER): Payer: BC Managed Care – PPO

## 2020-09-24 ENCOUNTER — Other Ambulatory Visit: Payer: Self-pay

## 2020-09-24 DIAGNOSIS — R072 Precordial pain: Secondary | ICD-10-CM

## 2020-09-24 LAB — ECHOCARDIOGRAM COMPLETE
AR max vel: 2.79 cm2
AV Area VTI: 3 cm2
AV Area mean vel: 2.67 cm2
AV Mean grad: 3 mmHg
AV Peak grad: 6.4 mmHg
Ao pk vel: 1.26 m/s
Area-P 1/2: 2.25 cm2
MV VTI: 2.47 cm2
S' Lateral: 2.9 cm

## 2020-09-25 DIAGNOSIS — F4323 Adjustment disorder with mixed anxiety and depressed mood: Secondary | ICD-10-CM | POA: Diagnosis not present

## 2020-10-08 ENCOUNTER — Telehealth (HOSPITAL_COMMUNITY): Payer: Self-pay | Admitting: *Deleted

## 2020-10-08 NOTE — Telephone Encounter (Signed)
Attempted to call patient regarding upcoming cardiac CT appointment. °Left message on voicemail with name and callback number ° °Youlanda Tomassetti RN Navigator Cardiac Imaging °[facility] °[phone] Office °[phone] Cell ° °

## 2020-10-09 ENCOUNTER — Other Ambulatory Visit: Payer: Self-pay

## 2020-10-09 ENCOUNTER — Ambulatory Visit
Admission: RE | Admit: 2020-10-09 | Discharge: 2020-10-09 | Disposition: A | Discharge status: Home / Self Care | Payer: BC Managed Care – PPO | Source: Ambulatory Visit | Attending: Cardiology | Admitting: Cardiology

## 2020-10-09 DIAGNOSIS — R072 Precordial pain: Secondary | ICD-10-CM | POA: Insufficient documentation

## 2020-10-09 MED ORDER — NITROGLYCERIN 0.4 MG SL SUBL
0.8000 mg | SUBLINGUAL_TABLET | Freq: Once | SUBLINGUAL | Status: AC
  Administered 2020-10-09: 0.8 mg via SUBLINGUAL

## 2020-10-09 MED ORDER — IOHEXOL 350 MG/ML SOLN
75.0000 mL | Freq: Once | INTRAVENOUS | Status: AC | PRN
  Administered 2020-10-09: 75 mL via INTRAVENOUS

## 2020-10-09 NOTE — Progress Notes (Signed)
Patient tolerated procedure well. Ambulate w/o difficulty. Sitting in chair drinking water provided. Encouraged to drink extra water today and reasoning explained. Verbalized understanding. All questions answered. ABC intact. No further needs. Discharge from procedure area w/o issues.  

## 2020-11-03 ENCOUNTER — Ambulatory Visit: Payer: BC Managed Care – PPO | Admitting: Cardiology

## 2020-11-03 ENCOUNTER — Encounter: Payer: Self-pay | Admitting: Cardiology

## 2020-11-03 ENCOUNTER — Other Ambulatory Visit: Payer: Self-pay

## 2020-11-03 VITALS — BP 112/68 | HR 65 | Ht 64.0 in | Wt 170.0 lb

## 2020-11-03 DIAGNOSIS — R072 Precordial pain: Secondary | ICD-10-CM

## 2020-11-03 DIAGNOSIS — Z8249 Family history of ischemic heart disease and other diseases of the circulatory system: Secondary | ICD-10-CM

## 2020-11-03 NOTE — Progress Notes (Signed)
Cardiology Office Note:    Date:  11/03/2020   ID:  Karla Banks, DOB 03-17-1970, MRN [id number]  PCP:  Rica Records, PA-C   [facility] Providers Cardiologist:  None     Referring MD: Rica Records, PA-C   Chief Complaint  Patient presents with   Other    Follow up post CT and ECHO. Meds reviewed verbally with patient.      History of Present Illness:    Karla Banks is a 50 y.o. female with a family history of early CAD who presents for follow-up.  She was last seen due to chest pain which she described as squeezing in nature radiating to her leg.  She has a strong family history of early CAD.  Due to symptoms and family history, echocardiogram and coronary CTA were ordered to evaluate presence of CAD.  She states feeling well, denies further episodes of chest discomfort.  Presents for follow-up after echocardiogram and CT were performed for results.  Prior notes  history of early CAD with father having heart attack in his 8s, mother MI in her 22s.  Past Medical History:  Diagnosis Date   Family history of breast cancer 01/01/2013   pt is My Risk neg; IBIS = 19.1%   Genetic testing of female    negative   History of mammogram 02/25/14; 05/12/15   NEG; NEG   History of Papanicolaou smear of cervix 01/28/2014   -/-   Intractable menstrual migraine without status migrainosus 01/28/2014   [place] OF OCPS. TRY CONT DOSING    Past Surgical History:  Procedure Laterality Date   CESAREAN SECTION     CYSTOSCOPY  1981   BLADDER STRETCHED AS A CHILD    Current Medications: Current Meds  Medication Sig   levonorgestrel (MIRENA, 52 MG,) 20 MCG/24HR IUD 1 Intra Uterine Device (1 each total) by Intrauterine route once for 1 dose.   nortriptyline (PAMELOR) 10 MG capsule Take 1 capsule by mouth daily as needed.   rizatriptan (MAXALT) 10 MG tablet Take 10 mg at headache onset can repeat once in 2 hours if needed no more than 2 tabs in 24 hours    sertraline (ZOLOFT) 50 MG tablet TAKE 1 DAILYWITH BREAKFAST     Allergies:   Patient has no known allergies.   Social History   Socioeconomic History   Marital status: Married    Spouse name: Not on file   Number of children: 1   Years of education: 16   Highest education level: Not on file  Occupational History   Not on file  Tobacco Use   Smoking status: Never   Smokeless tobacco: Never  Vaping Use   Vaping Use: Never used  Substance and Sexual Activity   Alcohol use: Yes   Drug use: No   Sexual activity: Yes    Birth control/protection: None, I.U.D.    Comment: Mirena  Other Topics Concern   Not on file  Social History Narrative   Not on file   Social Determinants of Health   Financial Resource Strain: Not on file  Food Insecurity: Not on file  Transportation Needs: Not on file  Physical Activity: Not on file  Stress: Not on file  Social Connections: Not on file     Family History: The patient's family history includes Cancer in some other family members; Cancer (age of onset: 51) in her maternal grandmother; Heart disease in her father; Hyperlipidemia in her father. There is  no history of Breast cancer.  ROS:   Please see the history of present illness.     All other systems reviewed and are negative.  EKGs/Labs/Other Studies Reviewed:    The following studies were reviewed today:   EKG:  EKG not  ordered today.    Recent Labs: 09/22/2020: BUN 9; Creatinine, Ser 0.71; Potassium 4.2; Sodium 137  Recent Lipid Panel    Component Value Date/Time   CHOL 160 07/05/2019 0821   TRIG 46 07/05/2019 0821   HDL 44 07/05/2019 0821   CHOLHDL 3.6 07/05/2019 0821   LDLCALC 106 (H) 07/05/2019 0821     Risk Assessment/Calculations:          Physical Exam:    VS:  BP 112/68 (BP Location: Left Arm, Patient Position: Sitting, Cuff Size: Normal)   Pulse 65   Ht 5\' 4"  (1.626 m)   Wt 170 lb (77.1 kg)   SpO2 99%   BMI 29.18 kg/m     Wt Readings from Last 3  Encounters:  11/03/20 170 lb (77.1 kg)  09/22/20 171 lb (77.6 kg)  07/08/20 166 lb (75.3 kg)     GEN:  Well nourished, well developed in no acute distress HEENT: Normal NECK: No JVD; No carotid bruits LYMPHATICS: No lymphadenopathy CARDIAC: RRR, no murmurs, rubs, gallops RESPIRATORY:  Clear to auscultation without rales, wheezing or rhonchi  ABDOMEN: Soft, non-tender, non-distended MUSCULOSKELETAL:  No edema; No deformity  SKIN: Warm and dry NEUROLOGIC:  Alert and oriented x 3 PSYCHIATRIC:  Normal affect   ASSESSMENT:    1. Precordial pain   2. Family history of early CAD     PLAN:    In order of problems listed above:  Chest pain, strong family history of early CAD.  Echo showed normal systolic and diastolic function, EF 60 to 65%.  Normal coronary calcium score of 0, no evidence for CAD.  Patient made aware of results and reassured. Family history of early CAD.  Patient's echo and coronary CTA were normal.  Advised on low-cholesterol diet, healthy cardiac lifestyle encouraged including exercise.  Follow-up as needed     Medication Adjustments/Labs and Tests Ordered: Current medicines are reviewed at length with the patient today.  Concerns regarding medicines are outlined above.  No orders of the defined types were placed in this encounter.  No orders of the defined types were placed in this encounter.   Patient Instructions  Medication Instructions:   Your physician recommends that you continue on your current medications as directed. Please refer to the Current Medication list given to you today.  *If you need a refill on your cardiac medications before your next appointment, please call your pharmacy*   Lab Work: None ordered If you have labs (blood work) drawn today and your tests are completely normal, you will receive your results only by: MyChart Message (if you have MyChart) OR A paper copy in the mail If you have any lab test that is abnormal or we  need to change your treatment, we will call you to review the results.   Testing/Procedures: None ordered   Follow-Up: At [facility], you and your health needs are our priority.  As part of our continuing mission to provide you with exceptional heart care, we have created designated Provider Care Teams.  These Care Teams include your primary Cardiologist (physician) and Advanced Practice Providers (APPs -  Physician Assistants and Nurse Practitioners) who all work together to provide you with the care you need,  when you need it.  We recommend signing up for the patient portal called "MyChart".  Sign up information is provided on this After Visit Summary.  MyChart is used to connect with patients for Virtual Visits (Telemedicine).  Patients are able to view lab/test results, encounter notes, upcoming appointments, etc.  Non-urgent messages can be sent to your provider as well.   To learn more about what you can do with MyChart, go to [email or web address].    Your next appointment:   Follow up as needed   The format for your next appointment:   In Person  Provider:   You may see Dr. Azucena Cecil or one of the following Advanced Practice Providers on your designated Care Team:   Nicolasa Ducking, NP Eula Listen, PA-C Marisue Ivan, PA-C Cadence [place], [place]   Other Instructions    Signed, Debbe Odea, MD  11/03/2020 12:02 PM    [facility]

## 2020-11-03 NOTE — Patient Instructions (Signed)
Medication Instructions:  Your physician recommends that you continue on your current medications as directed. Please refer to the Current Medication list given to you today.  *If you need a refill on your cardiac medications before your next appointment, please call your pharmacy*   Lab Work: None ordered If you have labs (blood work) drawn today and your tests are completely normal, you will receive your results only by: MyChart Message (if you have MyChart) OR A paper copy in the mail If you have any lab test that is abnormal or we need to change your treatment, we will call you to review the results.   Testing/Procedures: None ordered   Follow-Up: At [facility], you and your health needs are our priority.  As part of our continuing mission to provide you with exceptional heart care, we have created designated Provider Care Teams.  These Care Teams include your primary Cardiologist (physician) and Advanced Practice Providers (APPs -  Physician Assistants and Nurse Practitioners) who all work together to provide you with the care you need, when you need it.  We recommend signing up for the patient portal called "MyChart".  Sign up information is provided on this After Visit Summary.  MyChart is used to connect with patients for Virtual Visits (Telemedicine).  Patients are able to view lab/test results, encounter notes, upcoming appointments, etc.  Non-urgent messages can be sent to your provider as well.   To learn more about what you can do with MyChart, go to [email or web address].    Your next appointment:   Follow up as needed   The format for your next appointment:   In Person  Provider:   You may see Dr. Agbor-Etang or one of the following Advanced Practice Providers on your designated Care Team:   Christopher Berge, NP Ryan Dunn, PA-C Jacquelyn Visser, PA-C Cadence Furth, PA-C   Other Instructions   

## 2020-11-13 DIAGNOSIS — F321 Major depressive disorder, single episode, moderate: Secondary | ICD-10-CM | POA: Diagnosis not present

## 2020-11-13 DIAGNOSIS — F411 Generalized anxiety disorder: Secondary | ICD-10-CM | POA: Diagnosis not present

## 2020-12-04 DIAGNOSIS — F411 Generalized anxiety disorder: Secondary | ICD-10-CM | POA: Diagnosis not present

## 2020-12-04 DIAGNOSIS — F321 Major depressive disorder, single episode, moderate: Secondary | ICD-10-CM | POA: Diagnosis not present

## 2021-01-02 NOTE — Telephone Encounter (Signed)
Mirena rcvd/charged 05/22/20

## 2021-04-28 ENCOUNTER — Telehealth: Payer: Self-pay

## 2021-04-28 NOTE — Telephone Encounter (Signed)
Copied from CRM [id number]. Topic: Appointment Scheduling - Scheduling Inquiry for Clinic >> Apr 28, 2021  1:01 PM Karla Banks D wrote: Reason for CRM: Pt called needing to see one of the new provider to establish care. CB# [phone]

## 2021-04-28 NOTE — Telephone Encounter (Signed)
Copied from [facility] [id number]. Topic: Appointment Scheduling - Scheduling Inquiry for Clinic >> Apr 28, 2021 11:09 AM Erick Blinks wrote: Reason for CRM: Pt needs a CPE as soon as possible, wants to establish with office  Best contact: [phone]

## 2021-04-28 NOTE — Telephone Encounter (Signed)
Appt made

## 2021-06-01 DIAGNOSIS — F411 Generalized anxiety disorder: Secondary | ICD-10-CM | POA: Diagnosis not present

## 2021-06-01 DIAGNOSIS — F321 Major depressive disorder, single episode, moderate: Secondary | ICD-10-CM | POA: Diagnosis not present

## 2021-06-22 DIAGNOSIS — F411 Generalized anxiety disorder: Secondary | ICD-10-CM | POA: Diagnosis not present

## 2021-06-22 DIAGNOSIS — F321 Major depressive disorder, single episode, moderate: Secondary | ICD-10-CM | POA: Diagnosis not present

## 2021-07-02 ENCOUNTER — Ambulatory Visit: Payer: Self-pay | Admitting: Internal Medicine

## 2021-09-10 DIAGNOSIS — R519 Headache, unspecified: Secondary | ICD-10-CM | POA: Diagnosis not present

## 2021-09-22 DIAGNOSIS — F321 Major depressive disorder, single episode, moderate: Secondary | ICD-10-CM | POA: Diagnosis not present

## 2021-09-22 DIAGNOSIS — F411 Generalized anxiety disorder: Secondary | ICD-10-CM | POA: Diagnosis not present

## 2021-09-28 ENCOUNTER — Other Ambulatory Visit: Payer: Self-pay | Admitting: Obstetrics and Gynecology

## 2021-09-28 DIAGNOSIS — Z1231 Encounter for screening mammogram for malignant neoplasm of breast: Secondary | ICD-10-CM

## 2021-10-01 ENCOUNTER — Ambulatory Visit: Payer: Self-pay | Admitting: Internal Medicine

## 2021-10-01 ENCOUNTER — Encounter: Payer: Self-pay | Admitting: Nurse Practitioner

## 2021-10-01 ENCOUNTER — Ambulatory Visit: Payer: Self-pay | Admitting: Nurse Practitioner

## 2021-10-01 ENCOUNTER — Other Ambulatory Visit: Payer: Self-pay

## 2021-10-01 VITALS — BP 120/74 | HR 82 | Temp 98.7°F | Resp 16 | Ht 64.0 in | Wt 187.9 lb

## 2021-10-01 DIAGNOSIS — Z1159 Encounter for screening for other viral diseases: Secondary | ICD-10-CM | POA: Diagnosis not present

## 2021-10-01 DIAGNOSIS — Z114 Encounter for screening for human immunodeficiency virus [HIV]: Secondary | ICD-10-CM

## 2021-10-01 DIAGNOSIS — R635 Abnormal weight gain: Secondary | ICD-10-CM

## 2021-10-01 DIAGNOSIS — F32A Depression, unspecified: Secondary | ICD-10-CM | POA: Insufficient documentation

## 2021-10-01 DIAGNOSIS — Z13220 Encounter for screening for lipoid disorders: Secondary | ICD-10-CM

## 2021-10-01 DIAGNOSIS — Z131 Encounter for screening for diabetes mellitus: Secondary | ICD-10-CM

## 2021-10-01 DIAGNOSIS — G43829 Menstrual migraine, not intractable, without status migrainosus: Secondary | ICD-10-CM

## 2021-10-01 DIAGNOSIS — F330 Major depressive disorder, recurrent, mild: Secondary | ICD-10-CM

## 2021-10-01 DIAGNOSIS — E669 Obesity, unspecified: Secondary | ICD-10-CM | POA: Insufficient documentation

## 2021-10-01 DIAGNOSIS — Z79899 Other long term (current) drug therapy: Secondary | ICD-10-CM

## 2021-10-01 DIAGNOSIS — Z7689 Persons encountering health services in other specified circumstances: Secondary | ICD-10-CM

## 2021-10-01 DIAGNOSIS — Z1211 Encounter for screening for malignant neoplasm of colon: Secondary | ICD-10-CM

## 2021-10-01 NOTE — Assessment & Plan Note (Signed)
Currently sees neurology for her migraines.  She says they are associated with her menstrual cycle.  She is currently taking Maxalt and nortriptyline when she needs it.

## 2021-10-01 NOTE — Assessment & Plan Note (Signed)
Patient reports she is doing well.  PHQ-9 and GAD scores are negative.  She currently sees psychiatry every 3 months.  She is currently taking Wellbutrin SR 100 mg daily.

## 2021-10-01 NOTE — Assessment & Plan Note (Signed)
Patient's current weight is 187 pounds with a BMI of 32.25.  Patient reports this is her highest weight.  We will get lab work.  Did discuss different types of weight loss medications.  Going to start with labs at this time.

## 2021-10-01 NOTE — Progress Notes (Signed)
BP 120/74   Pulse 82   Temp 98.7 F (37.1 C) (Oral)   Resp 16   Ht 5\' 4"  (1.626 m)   Wt 187 lb 14.4 oz (85.2 kg)   SpO2 99%   BMI 32.25 kg/m    Subjective:    Patient ID: , female    DOB: 1970-05-04, 51 y.o.   MRN: [id number]  HPI: Karla Banks is a 51 y.o. female  Chief Complaint  Patient presents with   Establish Care   Establish care: Her last physical was sees GYN every year.  Her last pap was 04/15/2017.  Her last mammogram was 07/14/2017, she is scheduled for one on 10/20/2021.  She has not had colon cancer screening . Will order cologuard.  She has been seeing OB/GYN, psychiatrist, and neurology.  Migraines: She sees neurology once a year.  She is currently taking Maxalt and nortriptyline when she needs it.  She says her migraines are associated with her menstrual cycle.   Depression: She is currently seeing psychiatry every three months. She is currently on Wellbutrin SR 100 mg daily. She says she was recently switched from zoloft due to weight gain.  Patient reports she is doing well.     10/01/2021    9:25 AM  Depression screen PHQ 2/9  Decreased Interest 0  Down, Depressed, Hopeless 0  PHQ - 2 Score 0  Altered sleeping 0  Tired, decreased energy 0  Change in appetite 0  Feeling bad or failure about yourself  0  Trouble concentrating 0  Moving slowly or fidgety/restless 0  Suicidal thoughts 0  PHQ-9 Score 0       10/01/2021    9:26 AM  GAD 7 : Generalized Anxiety Score  Nervous, Anxious, on Edge 0  Control/stop worrying 0  Worry too much - different things 0  Trouble relaxing 0  Restless 0  Easily annoyed or irritable 0  Afraid - awful might happen 0  Total GAD 7 Score 0  Anxiety Difficulty Not difficult at all   Obesity/weight gain: She says she has been slowly gaining weight.  She says this is her heaviest weight. Today she weighed 187 lbs with a BMI of 32.25. She says she has been living the same lifestyle, watching what she eats and  working out. Will get labs.  Discussed weight loss medications.  She understands that majority of them on backorder at this time.  Start with lab work and reassess after that.  Relevant past medical, surgical, family and social history reviewed and updated as indicated. Interim medical history since our last visit reviewed. Allergies and medications reviewed and updated.  Review of Systems  Constitutional: Negative for fever, positive for weight change.  Respiratory: Negative for cough and shortness of breath.   Cardiovascular: Negative for chest pain or palpitations.  Gastrointestinal: Negative for abdominal pain, no bowel changes.  Musculoskeletal: Negative for gait problem or joint swelling.  Skin: Negative for rash.  Neurological: Negative for dizziness or headache.  No other specific complaints in a complete review of systems (except as listed in HPI above).      Objective:    BP 120/74   Pulse 82   Temp 98.7 F (37.1 C) (Oral)   Resp 16   Ht 5\' 4"  (1.626 m)   Wt 187 lb 14.4 oz (85.2 kg)   SpO2 99%   BMI 32.25 kg/m   Wt Readings from Last 3 Encounters:  10/01/21 187 lb 14.4 oz (  85.2 kg)  11/03/20 170 lb (77.1 kg)  09/22/20 171 lb (77.6 kg)    Physical Exam  Constitutional: Patient appears well-developed and well-nourished. Obese  No distress.  HEENT: head atraumatic, normocephalic, pupils equal and reactive to light, neck supple Cardiovascular: Normal rate, regular rhythm and normal heart sounds.  No murmur heard. No BLE edema. Pulmonary/Chest: Effort normal and breath sounds normal. No respiratory distress. Abdominal: Soft.  There is no tenderness. Psychiatric: Patient has a normal mood and affect. behavior is normal. Judgment and thought content normal.  Results for orders placed or performed in visit on 09/24/20  ECHOCARDIOGRAM COMPLETE  Result Value Ref Range   AR max vel 2.79 cm2   AV Peak grad 6.4 mmHg   Ao pk vel 1.26 m/s   S' Lateral 2.90 cm   Area-P 1/2  2.25 cm2   AV Area VTI 3.00 cm2   AV Mean grad 3.0 mmHg   AV Area mean vel 2.67 cm2   MV VTI 2.47 cm2      Assessment & Plan:   Problem List Items Addressed This Visit       Cardiovascular and Mediastinum   Menstrual migraine without status migrainosus, not intractable - Primary    Currently sees neurology for her migraines.  She says they are associated with her menstrual cycle.  She is currently taking Maxalt and nortriptyline when she needs it.      Relevant Medications   buPROPion ER (WELLBUTRIN SR) 100 MG 12 hr tablet     Other   Depression    Patient reports she is doing well.  PHQ-9 and GAD scores are negative.  She currently sees psychiatry every 3 months.  She is currently taking Wellbutrin SR 100 mg daily.      Relevant Medications   buPROPion ER (WELLBUTRIN SR) 100 MG 12 hr tablet   Obesity (BMI 30-39.9)    Patient's current weight is 187 pounds with a BMI of 32.25.  Patient reports this is her highest weight.  We will get lab work.  Did discuss different types of weight loss medications.  Going to start with labs at this time.      Relevant Orders   TSH   Other Visit Diagnoses     Encounter to establish care       Screening for colon cancer       Relevant Orders   Cologuard   Screening for diabetes mellitus       Relevant Orders   COMPLETE METABOLIC PANEL WITH GFR   Hemoglobin A1c   Screening for cholesterol level       Relevant Orders   Lipid panel   Encounter for hepatitis C screening test for low risk patient       Relevant Orders   Hepatitis C antibody   Screening for HIV without presence of risk factors       Relevant Orders   HIV Antibody (routine testing w rflx)   Weight gain       Patient reports recently she has been slowly gaining weight.  We will get labs.   Relevant Orders   Hemoglobin A1c   TSH   Long term current use of antipsychotic medication       Relevant Orders   CBC with Differential/Platelet   COMPLETE METABOLIC PANEL WITH  GFR        Follow up plan: Return in about 3 months (around 01/01/2022) for follow up. If interested in weight loss medication.

## 2021-10-02 LAB — CBC WITH DIFFERENTIAL/PLATELET
Absolute Monocytes: 615 cells/uL (ref 200–950)
Basophils Absolute: 38 cells/uL (ref 0–200)
Basophils Relative: 0.5 %
Eosinophils Absolute: 83 cells/uL (ref 15–500)
Eosinophils Relative: 1.1 %
HCT: 46.5 % — ABNORMAL HIGH (ref 35.0–45.0)
Hemoglobin: 15.6 g/dL — ABNORMAL HIGH (ref 11.7–15.5)
Lymphs Abs: 2250 cells/uL (ref 850–3900)
MCH: 30.9 pg (ref 27.0–33.0)
MCHC: 33.5 g/dL (ref 32.0–36.0)
MCV: 92.1 fL (ref 80.0–100.0)
MPV: 9.6 fL (ref 7.5–12.5)
Monocytes Relative: 8.2 %
Neutro Abs: 4515 cells/uL (ref 1500–7800)
Neutrophils Relative %: 60.2 %
Platelets: 365 10*3/uL (ref 140–400)
RBC: 5.05 10*6/uL (ref 3.80–5.10)
RDW: 13.2 % (ref 11.0–15.0)
Total Lymphocyte: 30 %
WBC: 7.5 10*3/uL (ref 3.8–10.8)

## 2021-10-02 LAB — LIPID PANEL
Cholesterol: 179 mg/dL (ref <200)
HDL: 34 mg/dL — ABNORMAL LOW (ref >=50)
LDL Cholesterol (Calc): 125 mg/dL (calc) — ABNORMAL HIGH
Non-HDL Cholesterol (Calc): 145 mg/dL (calc) — ABNORMAL HIGH (ref <130)
Total CHOL/HDL Ratio: 5.3 (calc) — ABNORMAL HIGH (ref <5.0)
Triglycerides: 102 mg/dL (ref <150)

## 2021-10-02 LAB — TSH: TSH: 1.3 mIU/L

## 2021-10-02 LAB — COMPLETE METABOLIC PANEL WITH GFR
AG Ratio: 1.5 (calc) (ref 1.0–2.5)
ALT: 13 U/L (ref 6–29)
AST: 16 U/L (ref 10–35)
Albumin: 4.1 g/dL (ref 3.6–5.1)
Alkaline phosphatase (APISO): 34 U/L (ref 31–125)
BUN: 19 mg/dL (ref 7–25)
CO2: 27 mmol/L (ref 20–32)
Calcium: 9 mg/dL (ref 8.6–10.2)
Chloride: 102 mmol/L (ref 98–110)
Creat: 0.94 mg/dL (ref 0.50–0.99)
Globulin: 2.8 g/dL (calc) (ref 1.9–3.7)
Glucose, Bld: 73 mg/dL (ref 65–99)
Potassium: 4.7 mmol/L (ref 3.5–5.3)
Sodium: 138 mmol/L (ref 135–146)
Total Bilirubin: 0.5 mg/dL (ref 0.2–1.2)
Total Protein: 6.9 g/dL (ref 6.1–8.1)
eGFR: 75 mL/min/{1.73_m2} (ref >=60)

## 2021-10-02 LAB — HEMOGLOBIN A1C
Hgb A1c MFr Bld: 5 % of total Hgb (ref <5.7)
Mean Plasma Glucose: 97 mg/dL
eAG (mmol/L): 5.4 mmol/L

## 2021-10-02 LAB — HEPATITIS C ANTIBODY: Hepatitis C Ab: NONREACTIVE

## 2021-10-02 LAB — HIV ANTIBODY (ROUTINE TESTING W REFLEX): HIV 1&2 Ab, 4th Generation: NONREACTIVE

## 2021-10-12 DIAGNOSIS — Z1211 Encounter for screening for malignant neoplasm of colon: Secondary | ICD-10-CM | POA: Diagnosis not present

## 2021-10-20 ENCOUNTER — Other Ambulatory Visit: Payer: Self-pay | Admitting: Obstetrics and Gynecology

## 2021-10-20 ENCOUNTER — Ambulatory Visit
Admission: RE | Admit: 2021-10-20 | Discharge: 2021-10-20 | Disposition: A | Discharge status: Home / Self Care | Payer: BC Managed Care – PPO | Source: Ambulatory Visit | Attending: Obstetrics and Gynecology | Admitting: Obstetrics and Gynecology

## 2021-10-20 DIAGNOSIS — Z1231 Encounter for screening mammogram for malignant neoplasm of breast: Secondary | ICD-10-CM

## 2021-10-20 LAB — COLOGUARD: COLOGUARD: NEGATIVE

## 2021-11-20 DIAGNOSIS — L298 Other pruritus: Secondary | ICD-10-CM | POA: Diagnosis not present

## 2021-11-20 DIAGNOSIS — D2371 Other benign neoplasm of skin of right lower limb, including hip: Secondary | ICD-10-CM | POA: Diagnosis not present

## 2021-11-20 DIAGNOSIS — L738 Other specified follicular disorders: Secondary | ICD-10-CM | POA: Diagnosis not present

## 2021-12-21 DIAGNOSIS — R519 Headache, unspecified: Secondary | ICD-10-CM | POA: Diagnosis not present

## 2021-12-31 DIAGNOSIS — F330 Major depressive disorder, recurrent, mild: Secondary | ICD-10-CM | POA: Insufficient documentation

## 2021-12-31 DIAGNOSIS — R635 Abnormal weight gain: Secondary | ICD-10-CM | POA: Insufficient documentation

## 2021-12-31 DIAGNOSIS — R490 Dysphonia: Secondary | ICD-10-CM | POA: Diagnosis not present

## 2021-12-31 NOTE — Progress Notes (Deleted)
There were no vitals taken for this visit.   Subjective:    Patient ID: Karla Banks, female    DOB: 05/11/1970, 51 y.o.   MRN: [id number]  HPI: Karla Banks is a 51 y.o. female  No chief complaint on file.   Migraines: Patient is currently taking Maxalt and nortriptyline.  She typically gets her migraines associated with her menstrual cycle.  She saw neurology on 12/21/2021.  No changes in her treatment plan.   Depression: She is currently treated by psychiatry.  She sees psychiatrist every 3 months.  She currently is on Wellbutrin SR 100 mg daily.  Patient reports she is doing well.     10/01/2021    9:25 AM  Depression screen PHQ 2/9  Decreased Interest 0  Down, Depressed, Hopeless 0  PHQ - 2 Score 0  Altered sleeping 0  Tired, decreased energy 0  Change in appetite 0  Feeling bad or failure about yourself  0  Trouble concentrating 0  Moving slowly or fidgety/restless 0  Suicidal thoughts 0  PHQ-9 Score 0       10/01/2021    9:26 AM  GAD 7 : Generalized Anxiety Score  Nervous, Anxious, on Edge 0  Control/stop worrying 0  Worry too much - different things 0  Trouble relaxing 0  Restless 0  Easily annoyed or irritable 0  Afraid - awful might happen 0  Total GAD 7 Score 0  Anxiety Difficulty Not difficult at all   Obesity/weight gain: Last office visit patient talked about how she had been slowly gaining weight.  She had reported that this was her heaviest weight.  Her weight at that time was 187 pounds with a BMI of 32.25.  Her weight today is ***with a BMI of ***.  She has been working on lifestyle modification including working out and eating healthier.  Last time we did discuss weight loss medications however the most of them were on backorder.  He is currently on Wellbutrin for depression.   Relevant past medical, surgical, family and social history reviewed and updated as indicated. Interim medical history since our last visit reviewed. Allergies and  medications reviewed and updated.  Review of Systems  Constitutional: Negative for fever, positive for weight change.  Respiratory: Negative for cough and shortness of breath.   Cardiovascular: Negative for chest pain or palpitations.  Gastrointestinal: Negative for abdominal pain, no bowel changes.  Musculoskeletal: Negative for gait problem or joint swelling.  Skin: Negative for rash.  Neurological: Negative for dizziness or headache.  No other specific complaints in a complete review of systems (except as listed in HPI above).      Objective:    There were no vitals taken for this visit.  Wt Readings from Last 3 Encounters:  10/01/21 187 lb 14.4 oz (85.2 kg)  11/03/20 170 lb (77.1 kg)  09/22/20 171 lb (77.6 kg)    Physical Exam  Constitutional: Patient appears well-developed and well-nourished. Obese  No distress.  HEENT: head atraumatic, normocephalic, pupils equal and reactive to light, neck supple Cardiovascular: Normal rate, regular rhythm and normal heart sounds.  No murmur heard. No BLE edema. Pulmonary/Chest: Effort normal and breath sounds normal. No respiratory distress. Abdominal: Soft.  There is no tenderness. Psychiatric: Patient has a normal mood and affect. behavior is normal. Judgment and thought content normal.  Results for orders placed or performed in visit on 10/01/21  Cologuard  Result Value Ref Range   COLOGUARD Negative Negative  Lipid panel  Result Value Ref Range   Cholesterol 179 <200 mg/dL   HDL 34 (L) > OR = 50 mg/dL   Triglycerides 102 <150 mg/dL   LDL Cholesterol (Calc) 125 (H) mg/dL (calc)   Total CHOL/HDL Ratio 5.3 (H) <5.0 (calc)   Non-HDL Cholesterol (Calc) 145 (H) <130 mg/dL (calc)  CBC with Differential/Platelet  Result Value Ref Range   WBC 7.5 3.8 - 10.8 Thousand/uL   RBC 5.05 3.80 - 5.10 Million/uL   Hemoglobin 15.6 (H) 11.7 - 15.5 g/dL   HCT 46.5 (H) 35.0 - 45.0 %   MCV 92.1 80.0 - 100.0 fL   MCH 30.9 27.0 - 33.0 pg   MCHC  33.5 32.0 - 36.0 g/dL   RDW 13.2 11.0 - 15.0 %   Platelets 365 140 - 400 Thousand/uL   MPV 9.6 7.5 - 12.5 fL   Neutro Abs 4,515 1,500 - 7,800 cells/uL   Lymphs Abs 2,250 850 - 3,900 cells/uL   Absolute Monocytes 615 200 - 950 cells/uL   Eosinophils Absolute 83 15 - 500 cells/uL   Basophils Absolute 38 0 - 200 cells/uL   Neutrophils Relative % 60.2 %   Total Lymphocyte 30.0 %   Monocytes Relative 8.2 %   Eosinophils Relative 1.1 %   Basophils Relative 0.5 %  COMPLETE METABOLIC PANEL WITH GFR  Result Value Ref Range   Glucose, Bld 73 65 - 99 mg/dL   BUN 19 7 - 25 mg/dL   Creat 0.94 0.50 - 0.99 mg/dL   eGFR 75 > OR = 60 mL/min/1.71m   BUN/Creatinine Ratio NOT APPLICABLE 6 - 22 (calc)   Sodium 138 135 - 146 mmol/L   Potassium 4.7 3.5 - 5.3 mmol/L   Chloride 102 98 - 110 mmol/L   CO2 27 20 - 32 mmol/L   Calcium 9.0 8.6 - 10.2 mg/dL   Total Protein 6.9 6.1 - 8.1 g/dL   Albumin 4.1 3.6 - 5.1 g/dL   Globulin 2.8 1.9 - 3.7 g/dL (calc)   AG Ratio 1.5 1.0 - 2.5 (calc)   Total Bilirubin 0.5 0.2 - 1.2 mg/dL   Alkaline phosphatase (APISO) 34 31 - 125 U/L   AST 16 10 - 35 U/L   ALT 13 6 - 29 U/L  Hemoglobin A1c  Result Value Ref Range   Hgb A1c MFr Bld 5.0 <5.7 % of total Hgb   Mean Plasma Glucose 97 mg/dL   eAG (mmol/L) 5.4 mmol/L  Hepatitis C antibody  Result Value Ref Range   Hepatitis C Ab NON-REACTIVE NON-REACTIVE  HIV Antibody (routine testing w rflx)  Result Value Ref Range   HIV 1&2 Ab, 4th Generation NON-REACTIVE NON-REACTIVE  TSH  Result Value Ref Range   TSH 1.30 mIU/L      Assessment & Plan:   Problem List Items Addressed This Visit   None    Follow up plan: No follow-ups on file. If interested in weight loss medication.

## 2022-01-01 ENCOUNTER — Ambulatory Visit: Payer: Self-pay | Admitting: Nurse Practitioner

## 2022-01-01 DIAGNOSIS — E669 Obesity, unspecified: Secondary | ICD-10-CM

## 2022-01-01 DIAGNOSIS — F330 Major depressive disorder, recurrent, mild: Secondary | ICD-10-CM

## 2022-01-01 DIAGNOSIS — R635 Abnormal weight gain: Secondary | ICD-10-CM

## 2022-01-01 DIAGNOSIS — G43829 Menstrual migraine, not intractable, without status migrainosus: Secondary | ICD-10-CM

## 2022-01-07 NOTE — Progress Notes (Signed)
BP 122/74   Pulse 88   Temp 98.2 F (36.8 C) (Oral)   Resp 16   Ht _0  (1.626 m)   Wt 184 lb 6.4 oz (83.6 kg)   SpO2 99%   BMI 31.65 kg/m    Subjective:    Patient ID: Karla Banks, female    DOB: 12-13-1970, 51 y.o.   MRN: [id number]  HPI: Karla Banks is a 51 y.o. female  Chief Complaint  Patient presents with   Depression   Anxiety   Migraine    Follow up    Migraines: Patient is currently taking Maxalt and nortriptyline.  She typically gets her migraines associated with her menstrual cycle.  She saw neurology on 12/21/2021.  No changes in her treatment plan.  Depression: She is currently treated by psychiatry.  She sees psychiatrist every 3 months.  She currently is on Wellbutrin SR 100 mg BID.  Patient reports she is doing well.     01/08/2022    9:08 AM 01/08/2022    9:06 AM 10/01/2021    9:25 AM  Depression screen PHQ 2/9  Decreased Interest 0 0 0  Down, Depressed, Hopeless 0 0 0  PHQ - 2 Score 0 0 0  Altered sleeping 0  0  Tired, decreased energy 0  0  Change in appetite 0  0  Feeling bad or failure about yourself  0  0  Trouble concentrating 0  0  Moving slowly or fidgety/restless 0  0  Suicidal thoughts 0  0  PHQ-9 Score 0  0  Difficult doing work/chores Not difficult at all         01/08/2022    9:08 AM 10/01/2021    9:26 AM  GAD 7 : Generalized Anxiety Score  Nervous, Anxious, on Edge 0 0  Control/stop worrying 0 0  Worry too much - different things 0 0  Trouble relaxing 0 0  Restless 0 0  Easily annoyed or irritable 0 0  Afraid - awful might happen 0 0  Total GAD 7 Score 0 0  Anxiety Difficulty Not difficult at all Not difficult at all   Obesity/weight gain: Last office visit patient talked about how she had been slowly gaining weight.  She had reported that this was her heaviest weight.  Her weight at that time was 187 pounds with a BMI of 32.25.  Her weight today is 184 lbs with a BMI of 31.65.  She has been working on lifestyle  modification including working out and eating healthier.  Last time we did discuss weight loss medications however the most of them were on backorder.  She is currently on Wellbutrin for depression. Discussed options for weightloss medication again with patient. She would like to start wegovy. Discussed with patient that it is hard to get.  Will give prescription and patient is going to call around and find a pharmacy that has it.  Will start PA.  Discussed side effects and administration.   Relevant past medical, surgical, family and social history reviewed and updated as indicated. Interim medical history since our last visit reviewed. Allergies and medications reviewed and updated.  Review of Systems  Constitutional: Negative for fever, positive for weight change.  Respiratory: Negative for cough and shortness of breath.   Cardiovascular: Negative for chest pain or palpitations.  Gastrointestinal: Negative for abdominal pain, no bowel changes.  Musculoskeletal: Negative for gait problem or joint swelling.  Skin: Negative for rash.  Neurological: Negative for  dizziness or headache.  No other specific complaints in a complete review of systems (except as listed in HPI above).      Objective:    BP 122/74   Pulse 88   Temp 98.2 F (36.8 C) (Oral)   Resp 16   Ht _0  (1.626 m)   Wt 184 lb 6.4 oz (83.6 kg)   SpO2 99%   BMI 31.65 kg/m   Wt Readings from Last 3 Encounters:  01/08/22 184 lb 6.4 oz (83.6 kg)  10/01/21 187 lb 14.4 oz (85.2 kg)  11/03/20 170 lb (77.1 kg)    Physical Exam  Constitutional: Patient appears well-developed and well-nourished. Obese  No distress.  HEENT: head atraumatic, normocephalic, pupils equal and reactive to light, neck supple Cardiovascular: Normal rate, regular rhythm and normal heart sounds.  No murmur heard. No BLE edema. Pulmonary/Chest: Effort normal and breath sounds normal. No respiratory distress. Abdominal: Soft.  There is no  tenderness. Psychiatric: Patient has a normal mood and affect. behavior is normal. Judgment and thought content normal.  Results for orders placed or performed in visit on 10/01/21  Cologuard  Result Value Ref Range   COLOGUARD Negative Negative  Lipid panel  Result Value Ref Range   Cholesterol 179 <200 mg/dL   HDL 34 (L) > OR = 50 mg/dL   Triglycerides 102 <150 mg/dL   LDL Cholesterol (Calc) 125 (H) mg/dL (calc)   Total CHOL/HDL Ratio 5.3 (H) <5.0 (calc)   Non-HDL Cholesterol (Calc) 145 (H) <130 mg/dL (calc)  CBC with Differential/Platelet  Result Value Ref Range   WBC 7.5 3.8 - 10.8 Thousand/uL   RBC 5.05 3.80 - 5.10 Million/uL   Hemoglobin 15.6 (H) 11.7 - 15.5 g/dL   HCT 46.5 (H) 35.0 - 45.0 %   MCV 92.1 80.0 - 100.0 fL   MCH 30.9 27.0 - 33.0 pg   MCHC 33.5 32.0 - 36.0 g/dL   RDW 13.2 11.0 - 15.0 %   Platelets 365 140 - 400 Thousand/uL   MPV 9.6 7.5 - 12.5 fL   Neutro Abs 4,515 1,500 - 7,800 cells/uL   Lymphs Abs 2,250 850 - 3,900 cells/uL   Absolute Monocytes 615 200 - 950 cells/uL   Eosinophils Absolute 83 15 - 500 cells/uL   Basophils Absolute 38 0 - 200 cells/uL   Neutrophils Relative % 60.2 %   Total Lymphocyte 30.0 %   Monocytes Relative 8.2 %   Eosinophils Relative 1.1 %   Basophils Relative 0.5 %  COMPLETE METABOLIC PANEL WITH GFR  Result Value Ref Range   Glucose, Bld 73 65 - 99 mg/dL   BUN 19 7 - 25 mg/dL   Creat 0.94 0.50 - 0.99 mg/dL   eGFR 75 > OR = 60 mL/min/1.56m   BUN/Creatinine Ratio NOT APPLICABLE 6 - 22 (calc)   Sodium 138 135 - 146 mmol/L   Potassium 4.7 3.5 - 5.3 mmol/L   Chloride 102 98 - 110 mmol/L   CO2 27 20 - 32 mmol/L   Calcium 9.0 8.6 - 10.2 mg/dL   Total Protein 6.9 6.1 - 8.1 g/dL   Albumin 4.1 3.6 - 5.1 g/dL   Globulin 2.8 1.9 - 3.7 g/dL (calc)   AG Ratio 1.5 1.0 - 2.5 (calc)   Total Bilirubin 0.5 0.2 - 1.2 mg/dL   Alkaline phosphatase (APISO) 34 31 - 125 U/L   AST 16 10 - 35 U/L   ALT 13 6 - 29 U/L  Hemoglobin A1c  Result  Value Ref Range   Hgb A1c MFr Bld 5.0 <5.7 % of total Hgb   Mean Plasma Glucose 97 mg/dL   eAG (mmol/L) 5.4 mmol/L  Hepatitis C antibody  Result Value Ref Range   Hepatitis C Ab NON-REACTIVE NON-REACTIVE  HIV Antibody (routine testing w rflx)  Result Value Ref Range   HIV 1&2 Ab, 4th Generation NON-REACTIVE NON-REACTIVE  TSH  Result Value Ref Range   TSH 1.30 mIU/L      Assessment & Plan:   Problem List Items Addressed This Visit       Cardiovascular and Mediastinum   Menstrual migraine without status migrainosus, not intractable - Primary    She currently sees neurology for her migraines. She is currently taking Maxalt and nortriptyline when she needs it.         Other   Obesity (BMI 30-39.9)    Patient has been working on lifestyle modification with no significant weight loss.  Loss medication discussed with patient.  Patient is currently on Wellbutrin.  Patient would like to try [facility].  We will start PA.  Patient given prescription and is aware of low inventory.  Patient is going to call around.      Relevant Medications   Semaglutide-Weight Management 0.25 MG/0.5ML SOAJ   Semaglutide-Weight Management 0.5 MG/0.5ML SOAJ (Start on 02/06/2022)   Mild episode of recurrent major depressive disorder ([place])    She is currently taking wellbutrin 100 mg BID and reports she is doing well.       Weight gain    Patient has been working on lifestyle modification with no significant weight loss.  Loss medication discussed with patient.  Patient is currently on Wellbutrin.  Patient would like to try [facility].  We will start PA.  Patient given prescription and is aware of low inventory.  Patient is going to call around.      Relevant Medications   Semaglutide-Weight Management 0.25 MG/0.5ML SOAJ   Semaglutide-Weight Management 0.5 MG/0.5ML SOAJ (Start on 02/06/2022)     Follow up plan:  Follow up in three months

## 2022-01-08 ENCOUNTER — Other Ambulatory Visit: Payer: Self-pay

## 2022-01-08 ENCOUNTER — Ambulatory Visit: Payer: BC Managed Care – PPO | Admitting: Nurse Practitioner

## 2022-01-08 ENCOUNTER — Encounter: Payer: Self-pay | Admitting: Nurse Practitioner

## 2022-01-08 VITALS — BP 122/74 | HR 88 | Temp 98.2°F | Resp 16 | Ht 64.0 in | Wt 184.4 lb

## 2022-01-08 DIAGNOSIS — E669 Obesity, unspecified: Secondary | ICD-10-CM | POA: Diagnosis not present

## 2022-01-08 DIAGNOSIS — F330 Major depressive disorder, recurrent, mild: Secondary | ICD-10-CM | POA: Diagnosis not present

## 2022-01-08 DIAGNOSIS — R635 Abnormal weight gain: Secondary | ICD-10-CM

## 2022-01-08 DIAGNOSIS — G43829 Menstrual migraine, not intractable, without status migrainosus: Secondary | ICD-10-CM

## 2022-01-08 MED ORDER — SEMAGLUTIDE-WEIGHT MANAGEMENT 0.25 MG/0.5ML ~~LOC~~ SOAJ: 0.2500 mg | SUBCUTANEOUS | 0 refills | Status: AC

## 2022-01-08 MED ORDER — SEMAGLUTIDE-WEIGHT MANAGEMENT 0.5 MG/0.5ML ~~LOC~~ SOAJ: 0.5000 mg | SUBCUTANEOUS | 0 refills | Status: AC

## 2022-01-08 NOTE — Assessment & Plan Note (Signed)
Patient has been working on lifestyle modification with no significant weight loss.  Loss medication discussed with patient.  Patient is currently on Wellbutrin.  Patient would like to try [facility].  We will start PA.  Patient given prescription and is aware of low inventory.  Patient is going to call around.

## 2022-01-08 NOTE — Assessment & Plan Note (Signed)
Patient has been working on lifestyle modification with no significant weight loss.  Loss medication discussed with patient.  Patient is currently on Wellbutrin.  Patient would like to try Wegovy.  We will start PA.  Patient given prescription and is aware of low inventory.  Patient is going to call around. 

## 2022-01-08 NOTE — Assessment & Plan Note (Signed)
She is currently taking wellbutrin 100 mg BID and reports she is doing well.

## 2022-01-08 NOTE — Assessment & Plan Note (Signed)
She currently sees neurology for her migraines. She is currently taking Maxalt and nortriptyline when she needs it.

## 2022-03-10 ENCOUNTER — Ambulatory Visit: Payer: BC Managed Care – PPO | Attending: Unknown Physician Specialty | Admitting: Speech Pathology

## 2022-03-16 ENCOUNTER — Ambulatory Visit: Payer: BC Managed Care – PPO | Admitting: Speech Pathology

## 2022-03-18 ENCOUNTER — Encounter: Payer: BC Managed Care – PPO | Admitting: Speech Pathology

## 2022-03-22 ENCOUNTER — Encounter: Payer: BC Managed Care – PPO | Admitting: Speech Pathology

## 2022-03-24 ENCOUNTER — Encounter: Payer: BC Managed Care – PPO | Admitting: Speech Pathology

## 2022-03-29 ENCOUNTER — Encounter: Payer: BC Managed Care – PPO | Admitting: Speech Pathology

## 2022-03-31 ENCOUNTER — Encounter: Payer: BC Managed Care – PPO | Admitting: Speech Pathology

## 2022-04-05 ENCOUNTER — Encounter: Payer: BC Managed Care – PPO | Admitting: Speech Pathology

## 2022-04-07 ENCOUNTER — Encounter: Payer: BC Managed Care – PPO | Admitting: Speech Pathology

## 2022-04-12 ENCOUNTER — Encounter: Payer: BC Managed Care – PPO | Admitting: Speech Pathology

## 2022-04-14 ENCOUNTER — Encounter: Payer: BC Managed Care – PPO | Admitting: Speech Pathology

## 2022-04-19 ENCOUNTER — Encounter: Payer: BC Managed Care – PPO | Admitting: Speech Pathology

## 2022-04-21 ENCOUNTER — Encounter: Payer: BC Managed Care – PPO | Admitting: Speech Pathology

## 2022-04-26 ENCOUNTER — Encounter: Payer: BC Managed Care – PPO | Admitting: Speech Pathology

## 2022-04-28 ENCOUNTER — Encounter: Payer: BC Managed Care – PPO | Admitting: Speech Pathology

## 2022-04-29 DIAGNOSIS — F321 Major depressive disorder, single episode, moderate: Secondary | ICD-10-CM | POA: Diagnosis not present

## 2022-04-29 DIAGNOSIS — F411 Generalized anxiety disorder: Secondary | ICD-10-CM | POA: Diagnosis not present

## 2022-05-03 ENCOUNTER — Encounter: Payer: BC Managed Care – PPO | Admitting: Speech Pathology

## 2022-05-05 ENCOUNTER — Encounter: Payer: BC Managed Care – PPO | Admitting: Speech Pathology

## 2022-06-02 DIAGNOSIS — M9901 Segmental and somatic dysfunction of cervical region: Secondary | ICD-10-CM | POA: Diagnosis not present

## 2022-06-02 DIAGNOSIS — M9904 Segmental and somatic dysfunction of sacral region: Secondary | ICD-10-CM | POA: Diagnosis not present

## 2022-06-02 DIAGNOSIS — M9900 Segmental and somatic dysfunction of head region: Secondary | ICD-10-CM | POA: Diagnosis not present

## 2022-06-02 DIAGNOSIS — M9903 Segmental and somatic dysfunction of lumbar region: Secondary | ICD-10-CM | POA: Diagnosis not present

## 2022-06-03 DIAGNOSIS — M9900 Segmental and somatic dysfunction of head region: Secondary | ICD-10-CM | POA: Diagnosis not present

## 2022-06-03 DIAGNOSIS — M9901 Segmental and somatic dysfunction of cervical region: Secondary | ICD-10-CM | POA: Diagnosis not present

## 2022-06-03 DIAGNOSIS — M9903 Segmental and somatic dysfunction of lumbar region: Secondary | ICD-10-CM | POA: Diagnosis not present

## 2022-06-03 DIAGNOSIS — M9904 Segmental and somatic dysfunction of sacral region: Secondary | ICD-10-CM | POA: Diagnosis not present

## 2022-06-07 DIAGNOSIS — M9901 Segmental and somatic dysfunction of cervical region: Secondary | ICD-10-CM | POA: Diagnosis not present

## 2022-06-07 DIAGNOSIS — M9900 Segmental and somatic dysfunction of head region: Secondary | ICD-10-CM | POA: Diagnosis not present

## 2022-06-07 DIAGNOSIS — M9904 Segmental and somatic dysfunction of sacral region: Secondary | ICD-10-CM | POA: Diagnosis not present

## 2022-06-07 DIAGNOSIS — M9903 Segmental and somatic dysfunction of lumbar region: Secondary | ICD-10-CM | POA: Diagnosis not present

## 2022-06-09 DIAGNOSIS — M9904 Segmental and somatic dysfunction of sacral region: Secondary | ICD-10-CM | POA: Diagnosis not present

## 2022-06-09 DIAGNOSIS — M9903 Segmental and somatic dysfunction of lumbar region: Secondary | ICD-10-CM | POA: Diagnosis not present

## 2022-06-09 DIAGNOSIS — M9900 Segmental and somatic dysfunction of head region: Secondary | ICD-10-CM | POA: Diagnosis not present

## 2022-06-09 DIAGNOSIS — M9901 Segmental and somatic dysfunction of cervical region: Secondary | ICD-10-CM | POA: Diagnosis not present

## 2022-06-10 DIAGNOSIS — M9904 Segmental and somatic dysfunction of sacral region: Secondary | ICD-10-CM | POA: Diagnosis not present

## 2022-06-10 DIAGNOSIS — M9900 Segmental and somatic dysfunction of head region: Secondary | ICD-10-CM | POA: Diagnosis not present

## 2022-06-10 DIAGNOSIS — M9901 Segmental and somatic dysfunction of cervical region: Secondary | ICD-10-CM | POA: Diagnosis not present

## 2022-06-10 DIAGNOSIS — M9903 Segmental and somatic dysfunction of lumbar region: Secondary | ICD-10-CM | POA: Diagnosis not present

## 2022-06-16 DIAGNOSIS — M9904 Segmental and somatic dysfunction of sacral region: Secondary | ICD-10-CM | POA: Diagnosis not present

## 2022-06-16 DIAGNOSIS — M9903 Segmental and somatic dysfunction of lumbar region: Secondary | ICD-10-CM | POA: Diagnosis not present

## 2022-06-16 DIAGNOSIS — M9900 Segmental and somatic dysfunction of head region: Secondary | ICD-10-CM | POA: Diagnosis not present

## 2022-06-16 DIAGNOSIS — M9901 Segmental and somatic dysfunction of cervical region: Secondary | ICD-10-CM | POA: Diagnosis not present

## 2022-06-23 DIAGNOSIS — M9904 Segmental and somatic dysfunction of sacral region: Secondary | ICD-10-CM | POA: Diagnosis not present

## 2022-06-23 DIAGNOSIS — M9901 Segmental and somatic dysfunction of cervical region: Secondary | ICD-10-CM | POA: Diagnosis not present

## 2022-06-23 DIAGNOSIS — M9900 Segmental and somatic dysfunction of head region: Secondary | ICD-10-CM | POA: Diagnosis not present

## 2022-06-23 DIAGNOSIS — M9903 Segmental and somatic dysfunction of lumbar region: Secondary | ICD-10-CM | POA: Diagnosis not present

## 2022-06-25 DIAGNOSIS — M9901 Segmental and somatic dysfunction of cervical region: Secondary | ICD-10-CM | POA: Diagnosis not present

## 2022-06-25 DIAGNOSIS — M9900 Segmental and somatic dysfunction of head region: Secondary | ICD-10-CM | POA: Diagnosis not present

## 2022-06-25 DIAGNOSIS — M9903 Segmental and somatic dysfunction of lumbar region: Secondary | ICD-10-CM | POA: Diagnosis not present

## 2022-06-25 DIAGNOSIS — M9904 Segmental and somatic dysfunction of sacral region: Secondary | ICD-10-CM | POA: Diagnosis not present

## 2022-06-30 DIAGNOSIS — M9904 Segmental and somatic dysfunction of sacral region: Secondary | ICD-10-CM | POA: Diagnosis not present

## 2022-06-30 DIAGNOSIS — M9900 Segmental and somatic dysfunction of head region: Secondary | ICD-10-CM | POA: Diagnosis not present

## 2022-06-30 DIAGNOSIS — M9901 Segmental and somatic dysfunction of cervical region: Secondary | ICD-10-CM | POA: Diagnosis not present

## 2022-06-30 DIAGNOSIS — M9903 Segmental and somatic dysfunction of lumbar region: Secondary | ICD-10-CM | POA: Diagnosis not present

## 2022-07-07 DIAGNOSIS — M9903 Segmental and somatic dysfunction of lumbar region: Secondary | ICD-10-CM | POA: Diagnosis not present

## 2022-07-07 DIAGNOSIS — M9901 Segmental and somatic dysfunction of cervical region: Secondary | ICD-10-CM | POA: Diagnosis not present

## 2022-07-07 DIAGNOSIS — M9900 Segmental and somatic dysfunction of head region: Secondary | ICD-10-CM | POA: Diagnosis not present

## 2022-07-07 DIAGNOSIS — M9904 Segmental and somatic dysfunction of sacral region: Secondary | ICD-10-CM | POA: Diagnosis not present

## 2022-08-24 DIAGNOSIS — F411 Generalized anxiety disorder: Secondary | ICD-10-CM | POA: Diagnosis not present

## 2022-08-24 DIAGNOSIS — F321 Major depressive disorder, single episode, moderate: Secondary | ICD-10-CM | POA: Diagnosis not present

## 2022-09-27 DIAGNOSIS — Z0100 Encounter for examination of eyes and vision without abnormal findings: Secondary | ICD-10-CM | POA: Diagnosis not present

## 2022-09-27 DIAGNOSIS — H538 Other visual disturbances: Secondary | ICD-10-CM | POA: Diagnosis not present

## 2022-10-05 ENCOUNTER — Encounter: Payer: Self-pay | Admitting: Nurse Practitioner

## 2022-10-06 ENCOUNTER — Other Ambulatory Visit: Payer: Self-pay | Admitting: Nurse Practitioner

## 2022-10-06 DIAGNOSIS — E669 Obesity, unspecified: Secondary | ICD-10-CM

## 2022-10-06 MED ORDER — SEMAGLUTIDE-WEIGHT MANAGEMENT 0.25 MG/0.5ML ~~LOC~~ SOAJ: 0.2500 mg | SUBCUTANEOUS | 0 refills | Status: AC

## 2022-11-03 ENCOUNTER — Encounter: Payer: Self-pay | Admitting: Nurse Practitioner

## 2022-11-04 ENCOUNTER — Ambulatory Visit: Payer: BC Managed Care – PPO | Admitting: Nurse Practitioner

## 2022-11-04 ENCOUNTER — Other Ambulatory Visit: Payer: Self-pay

## 2022-11-04 ENCOUNTER — Encounter: Payer: Self-pay | Admitting: Nurse Practitioner

## 2022-11-04 VITALS — BP 118/72 | HR 78 | Temp 97.9°F | Resp 16 | Ht 64.0 in | Wt 174.6 lb

## 2022-11-04 DIAGNOSIS — Z131 Encounter for screening for diabetes mellitus: Secondary | ICD-10-CM | POA: Diagnosis not present

## 2022-11-04 DIAGNOSIS — E782 Mixed hyperlipidemia: Secondary | ICD-10-CM

## 2022-11-04 DIAGNOSIS — Z23 Encounter for immunization: Secondary | ICD-10-CM | POA: Diagnosis not present

## 2022-11-04 DIAGNOSIS — F330 Major depressive disorder, recurrent, mild: Secondary | ICD-10-CM | POA: Diagnosis not present

## 2022-11-04 DIAGNOSIS — G43829 Menstrual migraine, not intractable, without status migrainosus: Secondary | ICD-10-CM | POA: Diagnosis not present

## 2022-11-04 DIAGNOSIS — E663 Overweight: Secondary | ICD-10-CM

## 2022-11-04 DIAGNOSIS — Z130 Encounter for screening for diseases of the blood and blood-forming organs and certain disorders involving the immune mechanism: Secondary | ICD-10-CM

## 2022-11-04 DIAGNOSIS — Z0189 Encounter for other specified special examinations: Secondary | ICD-10-CM

## 2022-11-04 NOTE — Progress Notes (Signed)
BP 118/72   Pulse 78   Temp 97.9 F (36.6 C) (Oral)   Resp 16   Ht 5\' 4"  (1.626 m)   Wt 174 lb 9.6 oz (79.2 kg)   SpO2 98%   BMI 29.97 kg/m    Subjective:    Patient ID: Karla Banks, female    DOB: 02-27-1971, 52 y.o.   MRN: [id number]  HPI: Karla Banks is a 52 y.o. female  Chief Complaint  Patient presents with   Obesity    Working with a Nutrionist need to discuss lab work    Migraines: Patient is currently taking Maxalt and nortriptyline.  She typically gets her migraines associated with her menstrual cycle.  She saw neurology on 12/21/2021.  Patient reports that she has been having a headache over the last two weeks.  She is trying to eat clean and focusing on eating healthy.  She sometimes forgets to take her nortriptyline.    Depression Managed by psychiatry Medication wellbutrin SR 100 mg BID Compliant yes Side effects no PHQ9 negative GAD negative      11/04/2022    9:41 AM 01/08/2022    9:08 AM 01/08/2022    9:06 AM 10/01/2021    9:25 AM  Depression screen PHQ 2/9  Decreased Interest 0 0 0 0  Down, Depressed, Hopeless 0 0 0 0  PHQ - 2 Score 0 0 0 0  Altered sleeping 0 0  0  Tired, decreased energy 0 0  0  Change in appetite 0 0  0  Feeling bad or failure about yourself  0 0  0  Trouble concentrating 0 0  0  Moving slowly or fidgety/restless 0 0  0  Suicidal thoughts 0 0  0  PHQ-9 Score 0 0  0  Difficult doing work/chores Not difficult at all Not difficult at all         11/04/2022    9:41 AM 01/08/2022    9:08 AM 10/01/2021    9:26 AM  GAD 7 : Generalized Anxiety Score  Nervous, Anxious, on Edge 0 0 0  Control/stop worrying 0 0 0  Worry too much - different things 0 0 0  Trouble relaxing 0 0 0  Restless 0 0 0  Easily annoyed or irritable 0 0 0  Afraid - awful might happen 0 0 0  Total GAD 7 Score 0 0 0  Anxiety Difficulty Not difficult at all Not difficult at all Not difficult at all   Overweight: one office visit patient talked  about how she had been slowly gaining weight.  She had reported that this was her heaviest weight.  Her weight at that time was 187 pounds with a BMI of 32.25.  Her weight at last visit was 184 lbs with a BMI of 31.65.  She has been working on lifestyle modification including working out and eating healthier.  Last time we did discuss weight loss medications however the most of them were on backorder.  She is currently on Wellbutrin for depression. Discussed options for weightloss medication again with patient. She would like to start wegovy. Discussed with patient that it is hard to get.    Discussed side effects and administration. Patient was never able to find the medication.  Recently resent prescription.  Patient reports she has not been able to get the wegovy. Patient reports she is working with a nutritionist and they would like her to get a full work up including hormones.  Labs  ordered per request. Discussed with patient that labs may not be covered by insurance. She verbalized understanding.  Current weight : 174 lbs BMI: 29.97 Previous weight:184 lbs Treatment Tried: lifestyle modification, on wellbutrin Comorbidities: migraines, hld  HLD:  -Medications: none -Last lipid panel:  Lipid Panel     Component Value Date/Time   CHOL 179 10/01/2021 1000   CHOL 160 07/05/2019 0821   TRIG 102 10/01/2021 1000   HDL 34 (L) 10/01/2021 1000   HDL 44 07/05/2019 0821   CHOLHDL 5.3 (H) 10/01/2021 1000   LDLCALC 125 (H) 10/01/2021 1000   LABVLDL 10 07/05/2019 0821   The 10-year ASCVD risk score (Arnett DK, et al., 2019) is: 1.6%   Values used to calculate the score:     Age: 75 years     Sex: Female     Is Non-Hispanic African American: No     Diabetic: No     Tobacco smoker: No     Systolic Blood Pressure: 118 mmHg     Is BP treated: No     HDL Cholesterol: 34 mg/dL     Total Cholesterol: 179 mg/dL     Relevant past medical, surgical, family and social history reviewed and updated as  indicated. Interim medical history since our last visit reviewed. Allergies and medications reviewed and updated.  Review of Systems  Constitutional: Negative for fever, positive for weight change.  Respiratory: Negative for cough and shortness of breath.   Cardiovascular: Negative for chest pain or palpitations.  Gastrointestinal: Negative for abdominal pain, no bowel changes.  Musculoskeletal: Negative for gait problem or joint swelling.  Skin: Negative for rash.  Neurological: Negative for dizziness or headache.  No other specific complaints in a complete review of systems (except as listed in HPI above).      Objective:    BP 118/72   Pulse 78   Temp 97.9 F (36.6 C) (Oral)   Resp 16   Ht 5\' 4"  (1.626 m)   Wt 174 lb 9.6 oz (79.2 kg)   SpO2 98%   BMI 29.97 kg/m   Wt Readings from Last 3 Encounters:  11/04/22 174 lb 9.6 oz (79.2 kg)  01/08/22 184 lb 6.4 oz (83.6 kg)  10/01/21 187 lb 14.4 oz (85.2 kg)    Physical Exam  Constitutional: Patient appears well-developed and well-nourished. Obese  No distress.  HEENT: head atraumatic, normocephalic, pupils equal and reactive to light, neck supple Cardiovascular: Normal rate, regular rhythm and normal heart sounds.  No murmur heard. No BLE edema. Pulmonary/Chest: Effort normal and breath sounds normal. No respiratory distress. Abdominal: Soft.  There is no tenderness. Psychiatric: Patient has a normal mood and affect. behavior is normal. Judgment and thought content normal.  Results for orders placed or performed in visit on 10/01/21  Cologuard  Result Value Ref Range   COLOGUARD Negative Negative  Lipid panel  Result Value Ref Range   Cholesterol 179 <200 mg/dL   HDL 34 (L) > OR = 50 mg/dL   Triglycerides 409 <811 mg/dL   LDL Cholesterol (Calc) 125 (H) mg/dL (calc)   Total CHOL/HDL Ratio 5.3 (H) <5.0 (calc)   Non-HDL Cholesterol (Calc) 145 (H) <130 mg/dL (calc)  CBC with Differential/Platelet  Result Value Ref Range    WBC 7.5 3.8 - 10.8 Thousand/uL   RBC 5.05 3.80 - 5.10 Million/uL   Hemoglobin 15.6 (H) 11.7 - 15.5 g/dL   HCT 91.4 (H) 78.2 - 95.6 %   MCV 92.1 80.0 - 100.0  fL   MCH 30.9 27.0 - 33.0 pg   MCHC 33.5 32.0 - 36.0 g/dL   RDW 16.1 09.6 - 04.5 %   Platelets 365 140 - 400 Thousand/uL   MPV 9.6 7.5 - 12.5 fL   Neutro Abs 4,515 1,500 - 7,800 cells/uL   Lymphs Abs 2,250 850 - 3,900 cells/uL   Absolute Monocytes 615 200 - 950 cells/uL   Eosinophils Absolute 83 15 - 500 cells/uL   Basophils Absolute 38 0 - 200 cells/uL   Neutrophils Relative % 60.2 %   Total Lymphocyte 30.0 %   Monocytes Relative 8.2 %   Eosinophils Relative 1.1 %   Basophils Relative 0.5 %  COMPLETE METABOLIC PANEL WITH GFR  Result Value Ref Range   Glucose, Bld 73 65 - 99 mg/dL   BUN 19 7 - 25 mg/dL   Creat 4.09 8.11 - 9.14 mg/dL   eGFR 75 > OR = 60 NW/GNF/6.21H0   BUN/Creatinine Ratio NOT APPLICABLE 6 - 22 (calc)   Sodium 138 135 - 146 mmol/L   Potassium 4.7 3.5 - 5.3 mmol/L   Chloride 102 98 - 110 mmol/L   CO2 27 20 - 32 mmol/L   Calcium 9.0 8.6 - 10.2 mg/dL   Total Protein 6.9 6.1 - 8.1 g/dL   Albumin 4.1 3.6 - 5.1 g/dL   Globulin 2.8 1.9 - 3.7 g/dL (calc)   AG Ratio 1.5 1.0 - 2.5 (calc)   Total Bilirubin 0.5 0.2 - 1.2 mg/dL   Alkaline phosphatase (APISO) 34 31 - 125 U/L   AST 16 10 - 35 U/L   ALT 13 6 - 29 U/L  Hemoglobin A1c  Result Value Ref Range   Hgb A1c MFr Bld 5.0 <5.7 % of total Hgb   Mean Plasma Glucose 97 mg/dL   eAG (mmol/L) 5.4 mmol/L  Hepatitis C antibody  Result Value Ref Range   Hepatitis C Ab NON-REACTIVE NON-REACTIVE  HIV Antibody (routine testing w rflx)  Result Value Ref Range   HIV 1&2 Ab, 4th Generation NON-REACTIVE NON-REACTIVE  TSH  Result Value Ref Range   TSH 1.30 mIU/L      Assessment & Plan:   Problem List Items Addressed This Visit       Cardiovascular and Mediastinum   Menstrual migraine without status migrainosus, not intractable - Primary    Reports she has been  having more headaches recently. She says she has not been good about taking her nortriptyline. Encouraged patient to be more consistent with her medication. If no improvement follow up with neurology.         Other   Obesity (BMI 30-39.9)    seeing nutritionist, and was requested to get labs done. orders placed       Mild episode of recurrent major depressive disorder (HCC)    Doing well, managed by psychiatry      Mixed hyperlipidemia   Relevant Orders   Lipid panel   Other Visit Diagnoses     Screening for diabetes mellitus       Relevant Orders   COMPLETE METABOLIC PANEL WITH GFR   Hemoglobin A1c   Screening for deficiency anemia       Relevant Orders   CBC with Differential/Platelet   Need for Tdap vaccination       Relevant Orders   Tdap vaccine greater than or equal to 7yo IM (Completed)   Patient request for diagnostic testing       seeing nutritionist, and was requested to  get labs done. orders placed   Relevant Orders   Estrogens, total   FSH/LH   Insulin, Free (Bioactive)   VITAMIN D 25 Hydroxy (Vit-D Deficiency, Fractures)   Vitamin B12   Iron, TIBC and Ferritin Panel   Thyroid Panel With TSH   Thyroid Peroxidase Antibodies (TPO) (REFL)   Iodine, Serum/Plasma   17-Hydroxyprogesterone   DHEA   Testosterone, Free, Total, SHBG   Prolactin         Follow up plan:   Return in about 6 months (around 05/07/2023) for follow up.

## 2022-11-04 NOTE — Assessment & Plan Note (Signed)
Reports she has been having more headaches recently. She says she has not been good about taking her nortriptyline. Encouraged patient to be more consistent with her medication. If no improvement follow up with neurology.

## 2022-11-04 NOTE — Assessment & Plan Note (Signed)
seeing nutritionist, and was requested to get labs done. orders placed

## 2022-11-04 NOTE — Assessment & Plan Note (Signed)
Doing well, managed by psychiatry

## 2022-11-10 ENCOUNTER — Encounter: Payer: Self-pay | Admitting: Nurse Practitioner

## 2022-11-19 LAB — PROLACTIN: Prolactin: 5.4 ng/mL

## 2022-11-19 LAB — FSH/LH
FSH: 7.6 m[IU]/mL
LH: 5.1 m[IU]/mL

## 2022-11-19 LAB — COMPLETE METABOLIC PANEL WITH GFR
AG Ratio: 1.6 (calc) (ref 1.0–2.5)
ALT: 16 U/L (ref 6–29)
AST: 15 U/L (ref 10–35)
Albumin: 4.2 g/dL (ref 3.6–5.1)
Alkaline phosphatase (APISO): 35 U/L (ref 31–125)
BUN: 20 mg/dL (ref 7–25)
CO2: 27 mmol/L (ref 20–32)
Calcium: 8.9 mg/dL (ref 8.6–10.2)
Chloride: 104 mmol/L (ref 98–110)
Creat: 0.85 mg/dL (ref 0.50–0.99)
Globulin: 2.7 g/dL (ref 1.9–3.7)
Glucose, Bld: 83 mg/dL (ref 65–99)
Potassium: 4.5 mmol/L (ref 3.5–5.3)
Sodium: 139 mmol/L (ref 135–146)
Total Bilirubin: 0.5 mg/dL (ref 0.2–1.2)
Total Protein: 6.9 g/dL (ref 6.1–8.1)
eGFR: 84 mL/min/{1.73_m2} (ref >=60)

## 2022-11-19 LAB — VITAMIN D 25 HYDROXY (VIT D DEFICIENCY, FRACTURES): Vit D, 25-Hydroxy: 62 ng/mL (ref 30–100)

## 2022-11-19 LAB — CBC WITH DIFFERENTIAL/PLATELET
Absolute Monocytes: 442 {cells}/uL (ref 200–950)
Basophils Absolute: 32 {cells}/uL (ref 0–200)
Basophils Relative: 0.5 %
Eosinophils Absolute: 32 {cells}/uL (ref 15–500)
Eosinophils Relative: 0.5 %
HCT: 39.2 % (ref 35.0–45.0)
Hemoglobin: 13.3 g/dL (ref 11.7–15.5)
Lymphs Abs: 2163 {cells}/uL (ref 850–3900)
MCH: 30.6 pg (ref 27.0–33.0)
MCHC: 33.9 g/dL (ref 32.0–36.0)
MCV: 90.3 fL (ref 80.0–100.0)
MPV: 10.1 fL (ref 7.5–12.5)
Monocytes Relative: 6.9 %
Neutro Abs: 3731 {cells}/uL (ref 1500–7800)
Neutrophils Relative %: 58.3 %
Platelets: 309 10*3/uL (ref 140–400)
RBC: 4.34 10*6/uL (ref 3.80–5.10)
RDW: 12.2 % (ref 11.0–15.0)
Total Lymphocyte: 33.8 %
WBC: 6.4 10*3/uL (ref 3.8–10.8)

## 2022-11-19 LAB — HEMOGLOBIN A1C
Hgb A1c MFr Bld: 5.3 %{Hb} (ref <5.7)
Mean Plasma Glucose: 105 mg/dL
eAG (mmol/L): 5.8 mmol/L

## 2022-11-19 LAB — THYROID PANEL WITH TSH
Free Thyroxine Index: 1.9 (ref 1.4–3.8)
T3 Uptake: 34 % (ref 22–35)
T4, Total: 5.7 ug/dL (ref 5.1–11.9)
TSH: 1.56 m[IU]/L

## 2022-11-19 LAB — IRON,TIBC AND FERRITIN PANEL
%SAT: 33 % (ref 16–45)
Ferritin: 91 ng/mL (ref 16–232)
Iron: 78 ug/dL (ref 40–190)
TIBC: 233 ug/dL — ABNORMAL LOW (ref 250–450)

## 2022-11-19 LAB — DHEA: DHEA: 177 ng/dL

## 2022-11-19 LAB — LIPID PANEL
Cholesterol: 160 mg/dL (ref <200)
HDL: 47 mg/dL — ABNORMAL LOW (ref >=50)
LDL Cholesterol (Calc): 98 mg/dL
Non-HDL Cholesterol (Calc): 113 mg/dL (ref <130)
Total CHOL/HDL Ratio: 3.4 (calc) (ref <5.0)
Triglycerides: 65 mg/dL (ref <150)

## 2022-11-19 LAB — THYROID PEROXIDASE ANTIBODIES (TPO) (REFL): Thyroperoxidase Ab SerPl-aCnc: 1 [IU]/mL (ref <9)

## 2022-11-19 LAB — ESTROGENS, TOTAL: Estrogen: 225 pg/mL

## 2022-11-19 LAB — 17-HYDROXYPROGESTERONE: 17-OH-Progesterone, LC/MS/MS: 26 ng/dL

## 2022-11-19 LAB — TESTOS,TOTAL,FREE AND SHBG (FEMALE)
Free Testosterone: 1.4 pg/mL (ref 0.1–6.4)
Sex Hormone Binding: 45 nmol/L (ref 17–124)
Testosterone, Total, LC-MS-MS: 12 ng/dL (ref 2–45)

## 2022-11-19 LAB — IODINE, SERUM/PLASMA: Iodine: 45 ug/L — ABNORMAL LOW (ref 52–109)

## 2022-11-19 LAB — INSULIN, FREE (BIOACTIVE): Insulin, Free: 1.3 u[IU]/mL — ABNORMAL LOW (ref 1.5–14.9)

## 2022-11-19 LAB — VITAMIN B12: Vitamin B-12: 396 pg/mL (ref 200–1100)

## 2022-12-21 DIAGNOSIS — F411 Generalized anxiety disorder: Secondary | ICD-10-CM | POA: Diagnosis not present

## 2022-12-21 DIAGNOSIS — F321 Major depressive disorder, single episode, moderate: Secondary | ICD-10-CM | POA: Diagnosis not present

## 2023-02-28 DIAGNOSIS — L570 Actinic keratosis: Secondary | ICD-10-CM | POA: Diagnosis not present

## 2023-02-28 DIAGNOSIS — D2271 Melanocytic nevi of right lower limb, including hip: Secondary | ICD-10-CM | POA: Diagnosis not present

## 2023-02-28 DIAGNOSIS — D225 Melanocytic nevi of trunk: Secondary | ICD-10-CM | POA: Diagnosis not present

## 2023-02-28 DIAGNOSIS — D2262 Melanocytic nevi of left upper limb, including shoulder: Secondary | ICD-10-CM | POA: Diagnosis not present

## 2023-02-28 DIAGNOSIS — D485 Neoplasm of uncertain behavior of skin: Secondary | ICD-10-CM | POA: Diagnosis not present

## 2023-02-28 DIAGNOSIS — C44719 Basal cell carcinoma of skin of left lower limb, including hip: Secondary | ICD-10-CM | POA: Diagnosis not present

## 2023-02-28 DIAGNOSIS — C4362 Malignant melanoma of left upper limb, including shoulder: Secondary | ICD-10-CM | POA: Diagnosis not present

## 2023-02-28 DIAGNOSIS — D2261 Melanocytic nevi of right upper limb, including shoulder: Secondary | ICD-10-CM | POA: Diagnosis not present

## 2023-02-28 DIAGNOSIS — D0359 Melanoma in situ of other part of trunk: Secondary | ICD-10-CM | POA: Diagnosis not present

## 2023-02-28 DIAGNOSIS — D0461 Carcinoma in situ of skin of right upper limb, including shoulder: Secondary | ICD-10-CM | POA: Diagnosis not present

## 2023-02-28 DIAGNOSIS — L538 Other specified erythematous conditions: Secondary | ICD-10-CM | POA: Diagnosis not present

## 2023-03-24 DIAGNOSIS — D0362 Melanoma in situ of left upper limb, including shoulder: Secondary | ICD-10-CM | POA: Diagnosis not present

## 2023-03-24 DIAGNOSIS — C4362 Malignant melanoma of left upper limb, including shoulder: Secondary | ICD-10-CM | POA: Diagnosis not present

## 2023-04-03 IMAGING — CT CT HEART MORP W/ CTA COR W/ SCORE W/ CA W/CM &/OR W/O CM
2 of 12 series · 6 of 20 positions shown, 7 images · non-contrast
Comparison: None.

Addendum:
CLINICAL DATA: Chest pain

EXAM:
Cardiac/Coronary  CTA
TECHNIQUE: The patient was scanned on a Siemens Somatom go.Top scanner.

[Series 26: multiphase % cta coronary 0.60 · axial · 0.32mm/px · z∈[-1110,-1030]mm · 4 of 3996 slices shown, 5 images]
[im 800/3996  vessel]
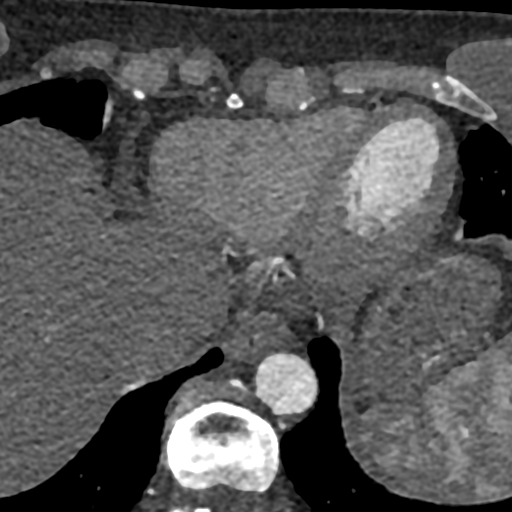
[im 800/3996  lung]
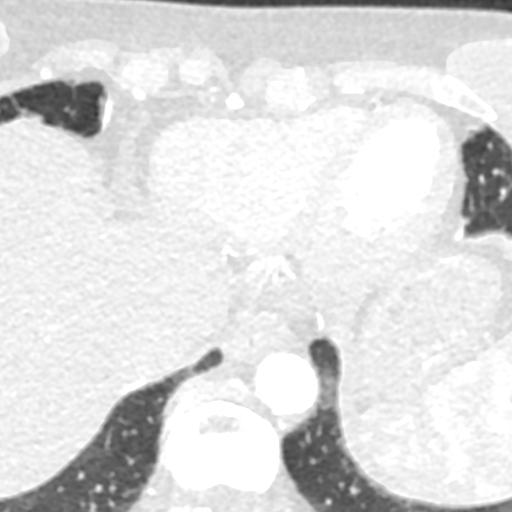
[im 1599/3996  vessel]
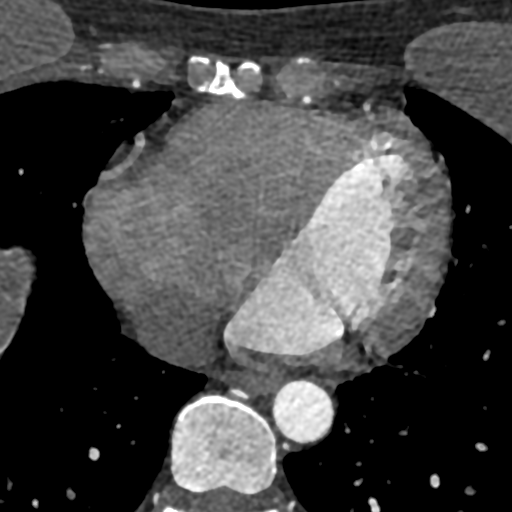
[im 2398/3996  vessel]
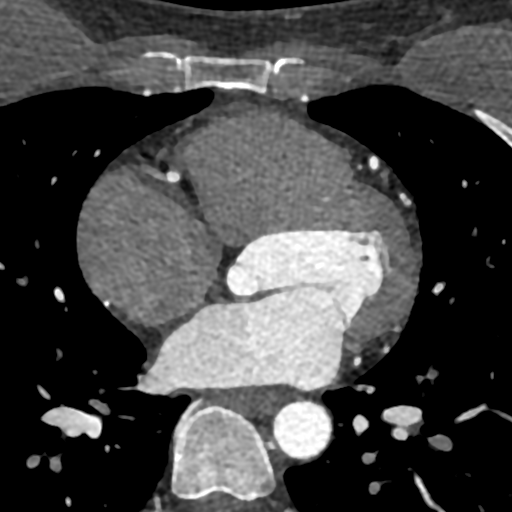
[im 3197/3996  vessel]
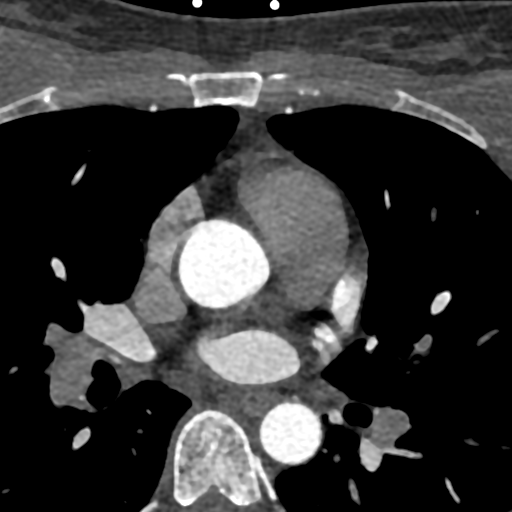

[Series 39: ms multiphase cta coronary 0.60 · axial · 0.32mm/px · z∈[-1093,-1048]mm · 2 of 2664 slices shown]
[im 888/2664  vessel]
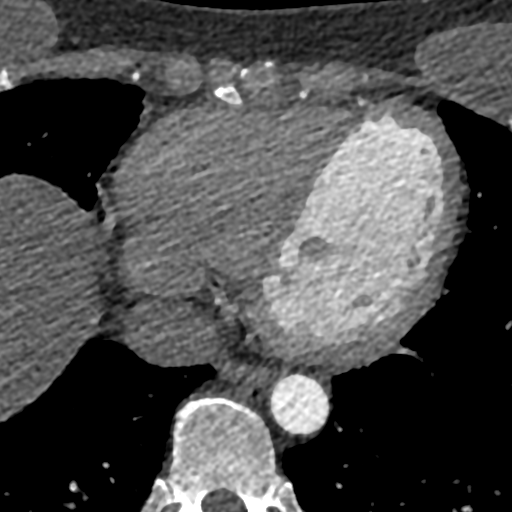
[im 1776/2664  vessel]
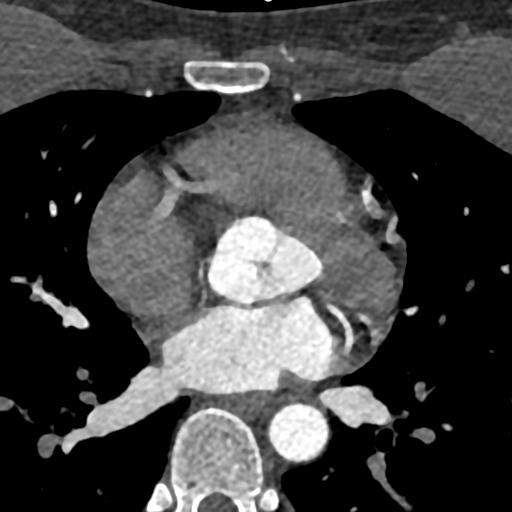

[6 of 20 positions shown; findings below may reference images not displayed]

:
A retrospective scan was triggered in the descending thoracic aorta.
Axial non-contrast 3 mm slices were carried out through the heart.
The data set was analyzed on a dedicated work station and scored
using the Agatson method. Gantry rotation speed was 330 msecs and
collimation was .6 mm. 50mg of metoprolol and 0.8 mg of sl NTG was
given. The 3D data set was reconstructed in 5% intervals of the
60-95 % of the R-R cycle. Diastolic phases were analyzed on a
dedicated work station using MPR, MIP and VRT modes. The patient
received 75 cc of contrast.
FINDINGS: Aorta:  Normal size.  No calcifications.  No dissection.

Aortic Valve:  Trileaflet.  No calcifications.

Coronary Arteries:  Normal coronary origin.  Right dominance.

RCA is a dominant artery that gives rise to PDA and PLA. There is no
plaque.

Left main has no disease.  It gives rise to LAD and LCX arteries.

LAD has no plaque.

LCX is a non-dominant artery that gives rise to two obtuse marginal
branches. There is no plaque.

Other findings:

Normal pulmonary vein drainage into the left atrium.

Normal left atrial appendage without a thrombus.

Normal size of the pulmonary artery.
IMPRESSION: 1. Normal coronary calcium score of 0. Patient is low risk for
coronary events.

2. Normal coronary origin with right dominance.

3. No evidence of CAD.

4. CAD-RADS 0. Consider non-atherosclerotic causes of chest pain.

EXAM:
OVER-READ INTERPRETATION  CT CHEST

The following report is an over-read performed by radiologist Dr.
over-read does not include interpretation of cardiac or coronary
anatomy or pathology. The coronary CTA interpretation by the
cardiologist is attached.
FINDINGS: Mediastinum/nodes: No mediastinal mass or adenopathy identified.

Lungs/pleura: There is no pleural effusion, airspace consolidation,
or atelectasis. No pneumothorax identified. Perifissural nodule
along the oblique fissure of the left lung is noted compatible with
a benign intrapulmonary lymph node, image 24/20 similarly, there is
a perifissural nodule along the major fissure of the right lung,
image [DATE]. No suspicious lung nodules identified at this time.

Upper abdomen: No acute abnormality. Several subcentimeter
low-attenuation foci within the liver are too small to reliably
characterize measuring less than 1 cm.

Musculoskeletal: No acute or suspicious osseous findings. Bilateral
breast implants noted.
IMPRESSION: 1. No active cardiopulmonary abnormalities.
2. Small bilateral perifissural nodules are identified. These are
most likely benign intrapulmonary lymph nodes.

*** End of Addendum ***
:
A retrospective scan was triggered in the descending thoracic aorta.
Axial non-contrast 3 mm slices were carried out through the heart.
The data set was analyzed on a dedicated work station and scored
using the Agatson method. Gantry rotation speed was 330 msecs and
collimation was .6 mm. 50mg of metoprolol and 0.8 mg of sl NTG was
given. The 3D data set was reconstructed in 5% intervals of the
60-95 % of the R-R cycle. Diastolic phases were analyzed on a
dedicated work station using MPR, MIP and VRT modes. The patient
received 75 cc of contrast.
FINDINGS: Aorta:  Normal size.  No calcifications.  No dissection.

Aortic Valve:  Trileaflet.  No calcifications.

Coronary Arteries:  Normal coronary origin.  Right dominance.

RCA is a dominant artery that gives rise to PDA and PLA. There is no
plaque.

Left main has no disease.  It gives rise to LAD and LCX arteries.

LAD has no plaque.

LCX is a non-dominant artery that gives rise to two obtuse marginal
branches. There is no plaque.

Other findings:

Normal pulmonary vein drainage into the left atrium.

Normal left atrial appendage without a thrombus.

Normal size of the pulmonary artery.
IMPRESSION: 1. Normal coronary calcium score of 0. Patient is low risk for
coronary events.

2. Normal coronary origin with right dominance.

3. No evidence of CAD.

4. CAD-RADS 0. Consider non-atherosclerotic causes of chest pain.

## 2023-04-07 DIAGNOSIS — D0359 Melanoma in situ of other part of trunk: Secondary | ICD-10-CM | POA: Diagnosis not present

## 2023-04-21 DIAGNOSIS — C44712 Basal cell carcinoma of skin of right lower limb, including hip: Secondary | ICD-10-CM | POA: Diagnosis not present

## 2023-04-21 DIAGNOSIS — D485 Neoplasm of uncertain behavior of skin: Secondary | ICD-10-CM | POA: Diagnosis not present

## 2023-04-21 DIAGNOSIS — C44719 Basal cell carcinoma of skin of left lower limb, including hip: Secondary | ICD-10-CM | POA: Diagnosis not present

## 2023-05-06 DIAGNOSIS — L578 Other skin changes due to chronic exposure to nonionizing radiation: Secondary | ICD-10-CM | POA: Diagnosis not present

## 2023-05-06 DIAGNOSIS — C44712 Basal cell carcinoma of skin of right lower limb, including hip: Secondary | ICD-10-CM | POA: Diagnosis not present

## 2023-05-06 DIAGNOSIS — D0461 Carcinoma in situ of skin of right upper limb, including shoulder: Secondary | ICD-10-CM | POA: Diagnosis not present

## 2023-05-09 ENCOUNTER — Ambulatory Visit: Payer: Self-pay | Admitting: Nurse Practitioner

## 2023-07-05 ENCOUNTER — Ambulatory Visit: Payer: BC Managed Care – PPO | Admitting: Nurse Practitioner

## 2023-07-07 DIAGNOSIS — D225 Melanocytic nevi of trunk: Secondary | ICD-10-CM | POA: Diagnosis not present

## 2023-07-07 DIAGNOSIS — D2261 Melanocytic nevi of right upper limb, including shoulder: Secondary | ICD-10-CM | POA: Diagnosis not present

## 2023-07-07 DIAGNOSIS — D0359 Melanoma in situ of other part of trunk: Secondary | ICD-10-CM | POA: Diagnosis not present

## 2023-07-07 DIAGNOSIS — D2262 Melanocytic nevi of left upper limb, including shoulder: Secondary | ICD-10-CM | POA: Diagnosis not present

## 2023-07-07 DIAGNOSIS — D2272 Melanocytic nevi of left lower limb, including hip: Secondary | ICD-10-CM | POA: Diagnosis not present

## 2023-07-07 DIAGNOSIS — D485 Neoplasm of uncertain behavior of skin: Secondary | ICD-10-CM | POA: Diagnosis not present

## 2023-08-04 DIAGNOSIS — D0359 Melanoma in situ of other part of trunk: Secondary | ICD-10-CM | POA: Diagnosis not present

## 2023-08-04 DIAGNOSIS — D225 Melanocytic nevi of trunk: Secondary | ICD-10-CM | POA: Diagnosis not present

## 2023-09-27 DIAGNOSIS — M9903 Segmental and somatic dysfunction of lumbar region: Secondary | ICD-10-CM | POA: Diagnosis not present

## 2023-09-27 DIAGNOSIS — M25551 Pain in right hip: Secondary | ICD-10-CM | POA: Diagnosis not present

## 2023-09-27 DIAGNOSIS — M9904 Segmental and somatic dysfunction of sacral region: Secondary | ICD-10-CM | POA: Diagnosis not present

## 2023-09-27 DIAGNOSIS — M5451 Vertebrogenic low back pain: Secondary | ICD-10-CM | POA: Diagnosis not present

## 2023-09-27 DIAGNOSIS — M25552 Pain in left hip: Secondary | ICD-10-CM | POA: Diagnosis not present

## 2023-09-29 DIAGNOSIS — M25552 Pain in left hip: Secondary | ICD-10-CM | POA: Diagnosis not present

## 2023-09-29 DIAGNOSIS — M25551 Pain in right hip: Secondary | ICD-10-CM | POA: Diagnosis not present

## 2023-09-29 DIAGNOSIS — M9904 Segmental and somatic dysfunction of sacral region: Secondary | ICD-10-CM | POA: Diagnosis not present

## 2023-09-29 DIAGNOSIS — M9903 Segmental and somatic dysfunction of lumbar region: Secondary | ICD-10-CM | POA: Diagnosis not present

## 2023-09-29 DIAGNOSIS — M5451 Vertebrogenic low back pain: Secondary | ICD-10-CM | POA: Diagnosis not present

## 2023-10-04 DIAGNOSIS — M25551 Pain in right hip: Secondary | ICD-10-CM | POA: Diagnosis not present

## 2023-10-04 DIAGNOSIS — M9903 Segmental and somatic dysfunction of lumbar region: Secondary | ICD-10-CM | POA: Diagnosis not present

## 2023-10-04 DIAGNOSIS — M5451 Vertebrogenic low back pain: Secondary | ICD-10-CM | POA: Diagnosis not present

## 2023-10-04 DIAGNOSIS — M25552 Pain in left hip: Secondary | ICD-10-CM | POA: Diagnosis not present

## 2023-10-04 DIAGNOSIS — M9904 Segmental and somatic dysfunction of sacral region: Secondary | ICD-10-CM | POA: Diagnosis not present

## 2023-10-06 DIAGNOSIS — M9904 Segmental and somatic dysfunction of sacral region: Secondary | ICD-10-CM | POA: Diagnosis not present

## 2023-10-06 DIAGNOSIS — M9903 Segmental and somatic dysfunction of lumbar region: Secondary | ICD-10-CM | POA: Diagnosis not present

## 2023-10-06 DIAGNOSIS — M25552 Pain in left hip: Secondary | ICD-10-CM | POA: Diagnosis not present

## 2023-10-06 DIAGNOSIS — M25551 Pain in right hip: Secondary | ICD-10-CM | POA: Diagnosis not present

## 2023-10-06 DIAGNOSIS — M5451 Vertebrogenic low back pain: Secondary | ICD-10-CM | POA: Diagnosis not present

## 2023-10-11 DIAGNOSIS — M9904 Segmental and somatic dysfunction of sacral region: Secondary | ICD-10-CM | POA: Diagnosis not present

## 2023-10-11 DIAGNOSIS — M9903 Segmental and somatic dysfunction of lumbar region: Secondary | ICD-10-CM | POA: Diagnosis not present

## 2023-10-11 DIAGNOSIS — M25551 Pain in right hip: Secondary | ICD-10-CM | POA: Diagnosis not present

## 2023-10-11 DIAGNOSIS — M25552 Pain in left hip: Secondary | ICD-10-CM | POA: Diagnosis not present

## 2023-10-11 DIAGNOSIS — M5451 Vertebrogenic low back pain: Secondary | ICD-10-CM | POA: Diagnosis not present

## 2023-10-13 DIAGNOSIS — M25551 Pain in right hip: Secondary | ICD-10-CM | POA: Diagnosis not present

## 2023-10-13 DIAGNOSIS — M25552 Pain in left hip: Secondary | ICD-10-CM | POA: Diagnosis not present

## 2023-10-13 DIAGNOSIS — M9904 Segmental and somatic dysfunction of sacral region: Secondary | ICD-10-CM | POA: Diagnosis not present

## 2023-10-13 DIAGNOSIS — M9903 Segmental and somatic dysfunction of lumbar region: Secondary | ICD-10-CM | POA: Diagnosis not present

## 2023-10-13 DIAGNOSIS — M5451 Vertebrogenic low back pain: Secondary | ICD-10-CM | POA: Diagnosis not present

## 2023-11-14 NOTE — Progress Notes (Unsigned)
 Gareth Mliss FALCON, FNP   No chief complaint on file.   HPI:      Ms. Karla Banks is a 53 y.o. G2P1011 whose LMP was No LMP recorded. (Menstrual status: IUD)., presents today for ***  Mirena  placed 3/22 Her menses had been absent with IUD but had removed 4/21 due to BTB and no longer needed for contraception. Then resumed her monthly, heavy menses, lasting 7 days. IUD replaced 05/22/20; having daily light to heavy spotting only now, no heavy bleeding. No dysmen  Patient Active Problem List   Diagnosis Date Noted   Mixed hyperlipidemia 11/04/2022   Mild episode of recurrent major depressive disorder (HCC) 12/31/2021   Weight gain 12/31/2021   Obesity (BMI 30-39.9) 10/01/2021   Family history of breast cancer 07/08/2020   Menorrhagia with regular cycle 05/22/2020   Headache disorder 06/01/2017   Menstrual migraine without status migrainosus, not intractable 05/29/2015    Past Surgical History:  Procedure Laterality Date   AUGMENTATION MAMMAPLASTY Bilateral    07/2017   CESAREAN SECTION     CYSTOSCOPY  1981   BLADDER STRETCHED AS A CHILD    Family History  Problem Relation Age of Onset   Heart disease Father    Hyperlipidemia Father    Cancer Maternal Grandmother 40       BREAST; RARE AORTIC CA - 70S   Cancer Other        BREAST   Cancer Other        BREAST   Breast cancer Neg Hx     Social History   Socioeconomic History   Marital status: Divorced    Spouse name: Not on file   Number of children: 1   Years of education: 16   Highest education level: Not on file  Occupational History   Not on file  Tobacco Use   Smoking status: Never   Smokeless tobacco: Never  Vaping Use   Vaping status: Never Used  Substance and Sexual Activity   Alcohol use: Yes   Drug use: No   Sexual activity: Yes    Birth control/protection: None, I.U.D.    Comment: Mirena   Other Topics Concern   Not on file  Social History Narrative   Not on file   Social Drivers of  Health   Financial Resource Strain: Not on file  Food Insecurity: Not on file  Transportation Needs: Not on file  Physical Activity: Not on file  Stress: Not on file  Social Connections: Not on file  Intimate Partner Violence: Not on file    Outpatient Medications Prior to Visit  Medication Sig Dispense Refill   buPROPion ER (WELLBUTRIN SR) 100 MG 12 hr tablet Take 100 mg by mouth 2 (two) times daily.     levonorgestrel  (MIRENA , 52 MG,) 20 MCG/24HR IUD 1 Intra Uterine Device (1 each total) by Intrauterine route once for 1 dose. 1 Intra Uterine Device 0   nortriptyline (PAMELOR) 10 MG capsule Take 1 capsule by mouth daily as needed.     rizatriptan (MAXALT) 10 MG tablet Take 10 mg at headache onset can repeat once in 2 hours if needed no more than 2 tabs in 24 hours     No facility-administered medications prior to visit.      ROS:  Review of Systems BREAST: No symptoms   OBJECTIVE:   Vitals:  There were no vitals taken for this visit.  Physical Exam  Results: No results found for this or any previous  visit (from the past 24 hours).   Assessment/Plan: No diagnosis found.    No orders of the defined types were placed in this encounter.     No follow-ups on file.  Akeiba Axelson B. Auburn Hester, PA-C 11/14/2023 5:34 PM

## 2023-11-15 ENCOUNTER — Ambulatory Visit (INDEPENDENT_AMBULATORY_CARE_PROVIDER_SITE_OTHER): Admitting: Obstetrics and Gynecology

## 2023-11-15 ENCOUNTER — Encounter: Payer: Self-pay | Admitting: Obstetrics and Gynecology

## 2023-11-15 ENCOUNTER — Other Ambulatory Visit (HOSPITAL_COMMUNITY)
Admission: RE | Admit: 2023-11-15 | Discharge: 2023-11-15 | Disposition: A | Discharge status: Home / Self Care | Source: Ambulatory Visit | Attending: Obstetrics and Gynecology | Admitting: Obstetrics and Gynecology

## 2023-11-15 VITALS — BP 101/65 | HR 70 | Ht 64.0 in | Wt 167.0 lb

## 2023-11-15 DIAGNOSIS — Z1151 Encounter for screening for human papillomavirus (HPV): Secondary | ICD-10-CM | POA: Insufficient documentation

## 2023-11-15 DIAGNOSIS — Z30431 Encounter for routine checking of intrauterine contraceptive device: Secondary | ICD-10-CM

## 2023-11-15 DIAGNOSIS — Z124 Encounter for screening for malignant neoplasm of cervix: Secondary | ICD-10-CM | POA: Diagnosis not present

## 2023-11-15 DIAGNOSIS — N939 Abnormal uterine and vaginal bleeding, unspecified: Secondary | ICD-10-CM

## 2023-11-15 DIAGNOSIS — Z1231 Encounter for screening mammogram for malignant neoplasm of breast: Secondary | ICD-10-CM

## 2023-11-15 NOTE — Patient Instructions (Signed)
 I value your feedback and you entrusting [place] with your care. If you get a [facility] [place] patient survey, I would appreciate you taking the time to let us know about your experience today. Thank you! ? ? ?

## 2023-11-16 ENCOUNTER — Ambulatory Visit: Payer: Self-pay | Admitting: Obstetrics and Gynecology

## 2023-11-16 ENCOUNTER — Ambulatory Visit: Admitting: Obstetrics and Gynecology

## 2023-11-16 DIAGNOSIS — L570 Actinic keratosis: Secondary | ICD-10-CM | POA: Diagnosis not present

## 2023-11-16 DIAGNOSIS — D2261 Melanocytic nevi of right upper limb, including shoulder: Secondary | ICD-10-CM | POA: Diagnosis not present

## 2023-11-16 DIAGNOSIS — L910 Hypertrophic scar: Secondary | ICD-10-CM | POA: Diagnosis not present

## 2023-11-16 DIAGNOSIS — D2272 Melanocytic nevi of left lower limb, including hip: Secondary | ICD-10-CM | POA: Diagnosis not present

## 2023-11-16 DIAGNOSIS — R208 Other disturbances of skin sensation: Secondary | ICD-10-CM | POA: Diagnosis not present

## 2023-11-16 DIAGNOSIS — D225 Melanocytic nevi of trunk: Secondary | ICD-10-CM | POA: Diagnosis not present

## 2023-11-16 DIAGNOSIS — D2262 Melanocytic nevi of left upper limb, including shoulder: Secondary | ICD-10-CM | POA: Diagnosis not present

## 2023-11-16 DIAGNOSIS — L820 Inflamed seborrheic keratosis: Secondary | ICD-10-CM | POA: Diagnosis not present

## 2023-11-16 DIAGNOSIS — L538 Other specified erythematous conditions: Secondary | ICD-10-CM | POA: Diagnosis not present

## 2023-11-16 LAB — CYTOLOGY - PAP
Comment: NEGATIVE
Diagnosis: NEGATIVE
High risk HPV: NEGATIVE

## 2023-11-16 LAB — TSH: TSH: 1.12 u[IU]/mL (ref 0.450–4.500)

## 2023-11-16 LAB — T4, FREE: Free T4: 1.24 ng/dL (ref 0.82–1.77)

## 2023-11-16 LAB — PROLACTIN: Prolactin: 6.9 ng/mL (ref 4.8–33.4)

## 2023-11-22 ENCOUNTER — Ambulatory Visit
Admission: RE | Admit: 2023-11-22 | Discharge: 2023-11-22 | Disposition: A | Discharge status: Home / Self Care | Source: Ambulatory Visit | Attending: Obstetrics and Gynecology | Admitting: Obstetrics and Gynecology

## 2023-11-22 DIAGNOSIS — N939 Abnormal uterine and vaginal bleeding, unspecified: Secondary | ICD-10-CM

## 2023-11-22 DIAGNOSIS — Z30430 Encounter for insertion of intrauterine contraceptive device: Secondary | ICD-10-CM | POA: Diagnosis not present

## 2023-11-22 DIAGNOSIS — Z30431 Encounter for routine checking of intrauterine contraceptive device: Secondary | ICD-10-CM

## 2023-11-29 ENCOUNTER — Other Ambulatory Visit: Payer: Self-pay | Admitting: Obstetrics and Gynecology

## 2023-11-29 DIAGNOSIS — N921 Excessive and frequent menstruation with irregular cycle: Secondary | ICD-10-CM

## 2023-11-29 MED ORDER — NORETHINDRONE 0.35 MG PO TABS: 1.0000 | ORAL_TABLET | Freq: Every day | ORAL | 0 refills | Status: AC

## 2023-11-29 NOTE — Progress Notes (Signed)
 Rx camila  for BTB with IUD; neg GYN u/s

## 2023-12-13 DIAGNOSIS — M722 Plantar fascial fibromatosis: Secondary | ICD-10-CM | POA: Diagnosis not present

## 2023-12-13 DIAGNOSIS — M79672 Pain in left foot: Secondary | ICD-10-CM | POA: Diagnosis not present

## 2024-01-10 ENCOUNTER — Ambulatory Visit
Admission: RE | Admit: 2024-01-10 | Discharge: 2024-01-10 | Disposition: A | Discharge status: Home / Self Care | Source: Ambulatory Visit | Attending: Obstetrics and Gynecology | Admitting: Obstetrics and Gynecology

## 2024-01-10 DIAGNOSIS — Z1231 Encounter for screening mammogram for malignant neoplasm of breast: Secondary | ICD-10-CM | POA: Insufficient documentation

## 2024-01-24 DIAGNOSIS — M722 Plantar fascial fibromatosis: Secondary | ICD-10-CM | POA: Diagnosis not present

## 2024-01-24 DIAGNOSIS — M79672 Pain in left foot: Secondary | ICD-10-CM | POA: Diagnosis not present

## 2024-03-05 ENCOUNTER — Other Ambulatory Visit: Payer: Self-pay | Admitting: Obstetrics and Gynecology

## 2024-03-05 DIAGNOSIS — N921 Excessive and frequent menstruation with irregular cycle: Secondary | ICD-10-CM
